# Patient Record
Sex: Male | Born: 1980 | Race: White | Hispanic: No | Marital: Married | Smoking: Current every day smoker
Health system: Southern US, Community
[De-identification: ages and names within clinical notes are randomized; demographics above are authoritative.]

## PROBLEM LIST (undated history)

## (undated) DIAGNOSIS — M479 Spondylosis, unspecified: Secondary | ICD-10-CM

## (undated) DIAGNOSIS — R011 Cardiac murmur, unspecified: Secondary | ICD-10-CM

## (undated) DIAGNOSIS — M545 Low back pain, unspecified: Secondary | ICD-10-CM

## (undated) DIAGNOSIS — N289 Disorder of kidney and ureter, unspecified: Secondary | ICD-10-CM

## (undated) DIAGNOSIS — Z87442 Personal history of urinary calculi: Secondary | ICD-10-CM

## (undated) DIAGNOSIS — N200 Calculus of kidney: Secondary | ICD-10-CM

## (undated) DIAGNOSIS — Z87898 Personal history of other specified conditions: Secondary | ICD-10-CM

---

## 2010-09-15 ENCOUNTER — Ambulatory Visit
Admission: RE | Admit: 2010-09-15 | Discharge: 2010-09-15 | Disposition: A | Discharge status: Home / Self Care | Payer: Medicaid Other | Source: Ambulatory Visit | Attending: Orthopedic Surgery | Admitting: Orthopedic Surgery

## 2010-09-15 ENCOUNTER — Other Ambulatory Visit: Payer: Self-pay | Admitting: Orthopedic Surgery

## 2010-09-15 DIAGNOSIS — M545 Low back pain: Secondary | ICD-10-CM

## 2010-09-17 ENCOUNTER — Ambulatory Visit: Payer: Medicaid Other | Admitting: Physical Therapy

## 2013-05-15 ENCOUNTER — Emergency Department (HOSPITAL_BASED_OUTPATIENT_CLINIC_OR_DEPARTMENT_OTHER)
Admission: EM | Admit: 2013-05-15 | Discharge: 2013-05-15 | Disposition: A | Discharge status: Home / Self Care | Payer: Medicaid Other | Attending: Emergency Medicine | Admitting: Emergency Medicine

## 2013-05-15 ENCOUNTER — Encounter (HOSPITAL_BASED_OUTPATIENT_CLINIC_OR_DEPARTMENT_OTHER): Payer: Self-pay | Admitting: Emergency Medicine

## 2013-05-15 DIAGNOSIS — K047 Periapical abscess without sinus: Secondary | ICD-10-CM | POA: Insufficient documentation

## 2013-05-15 DIAGNOSIS — F17200 Nicotine dependence, unspecified, uncomplicated: Secondary | ICD-10-CM | POA: Insufficient documentation

## 2013-05-15 MED ORDER — HYDROCODONE-ACETAMINOPHEN 5-325 MG PO TABS: 1.0000 | ORAL_TABLET | ORAL | Status: DC | PRN

## 2013-05-15 MED ORDER — CLINDAMYCIN HCL 150 MG PO CAPS: 150.0000 mg | ORAL_CAPSULE | Freq: Three times a day (TID) | ORAL | Status: DC

## 2013-05-15 NOTE — ED Notes (Signed)
Toothache x 3 days. He woke with swelling to his left jaw.

## 2013-05-15 NOTE — ED Provider Notes (Signed)
History/physical exam/procedure(s) were performed by non-physician practitioner and as supervising physician I was immediately available for consultation/collaboration. I have reviewed all notes and am in agreement with care and plan.   Yong Wahlquist S Caitriona Sundquist, MD 05/15/13 1608 

## 2013-05-15 NOTE — ED Provider Notes (Signed)
CSN: [id number]     Arrival date & time 05/15/13  1110 History   First MD Initiated Contact with Patient 05/15/13 1125     Chief Complaint  Patient presents with  . Dental Pain     (Consider location/radiation/quality/duration/timing/severity/associated sxs/prior Treatment) HPI Comments: Pt states that he cracked the tooth a long time ago and the pain started 3 days ago and then he woke up with it swollen this morning  Patient is a 33 y.o. male presenting with tooth pain. The history is provided by the patient. No language interpreter was used.  Dental Pain Location:  Lower Quality:  Aching Severity:  Moderate Onset quality:  Gradual Timing:  Constant Progression:  Worsening Chronicity:  New Context: abscess     History reviewed. No pertinent past medical history. History reviewed. No pertinent past surgical history. No family history on file. History  Substance Use Topics  . Smoking status: Current Every Day Smoker -- 1.00 packs/day    Types: Cigarettes  . Smokeless tobacco: Not on file  . Alcohol Use: Yes    Review of Systems  Constitutional: Negative.   Respiratory: Negative.   Cardiovascular: Negative.       Allergies  Review of patient's allergies indicates no known allergies.  Home Medications   Current Outpatient Rx  Name  Route  Sig  Dispense  Refill  . clindamycin (CLEOCIN) 150 MG capsule   Oral   Take 1 capsule (150 mg total) by mouth 3 (three) times daily.   21 capsule   0   . HYDROcodone-acetaminophen (NORCO/VICODIN) 5-325 MG per tablet   Oral   Take 1-2 tablets by mouth every 4 (four) hours as needed.   15 tablet   0    BP 151/83  Pulse 63  Temp(Src) 98 F (36.7 C) (Oral)  Resp 20  Ht 5\' 5"  (1.651 m)  Wt 145 lb (65.772 kg)  BMI 24.13 kg/m2  SpO2 100% Physical Exam  Vitals reviewed. Constitutional: He appears well-developed and well-nourished.  HENT:  Right Ear: External ear normal.  Left Ear: External ear normal.    Mouth/Throat:    Cardiovascular: Normal rate and regular rhythm.   Pulmonary/Chest: Effort normal and breath sounds normal.    ED Course  Procedures (including critical care time) Labs Review Labs Reviewed - No data to display Imaging Review No results found.   EKG Interpretation None      MDM   Final diagnoses:  Dental abscess    Pt treated with clinda and hydrocodone and given dental referral. Pt has no airway compromise or sign of ludwigs angina    Teressa LowerVrinda Jamarien Rodkey, NP 05/15/13 1149

## 2013-05-15 NOTE — Discharge Instructions (Signed)
Abscessed Tooth  An abscessed tooth is an infection around your tooth. It may be caused by holes or damage to the tooth (cavity) or a dental disease. An abscessed tooth causes mild to very bad pain in and around the tooth. See your dentist right away if you have tooth or gum pain.  HOME CARE   Take your medicine as told. Finish it even if you start to feel better.   Do not drive after taking pain medicine.   Rinse your mouth (gargle) often with salt water ( teaspoon salt in 8 ounces of warm water).   Do not apply heat to the outside of your face.  GET HELP RIGHT AWAY IF:    You have a temperature by mouth above 102 F (38.9 C), not controlled by medicine.   You have chills and a very bad headache.   You have problems breathing or swallowing.   Your mouth will not open.   You develop puffiness (swelling) on the neck or around the eye.   Your pain is not helped by medicine.   Your pain is getting worse instead of better.  MAKE SURE YOU:    Understand these instructions.   Will watch your condition.   Will get help right away if you are not doing well or get worse.  Document Released: 07/08/2007 Document Revised: 04/13/2011 Document Reviewed: 04/29/2010  Exit[employer] Patient Information 2014 [employer].

## 2013-10-22 ENCOUNTER — Emergency Department (HOSPITAL_BASED_OUTPATIENT_CLINIC_OR_DEPARTMENT_OTHER)
Admission: EM | Admit: 2013-10-22 | Discharge: 2013-10-22 | Disposition: A | Discharge status: Home / Self Care | Payer: Medicaid Other | Attending: Emergency Medicine | Admitting: Emergency Medicine

## 2013-10-22 ENCOUNTER — Encounter (HOSPITAL_BASED_OUTPATIENT_CLINIC_OR_DEPARTMENT_OTHER): Payer: Self-pay | Admitting: Emergency Medicine

## 2013-10-22 DIAGNOSIS — Z792 Long term (current) use of antibiotics: Secondary | ICD-10-CM | POA: Insufficient documentation

## 2013-10-22 DIAGNOSIS — M545 Low back pain, unspecified: Secondary | ICD-10-CM

## 2013-10-22 DIAGNOSIS — IMO0002 Reserved for concepts with insufficient information to code with codable children: Secondary | ICD-10-CM | POA: Insufficient documentation

## 2013-10-22 DIAGNOSIS — W010XXA Fall on same level from slipping, tripping and stumbling without subsequent striking against object, initial encounter: Secondary | ICD-10-CM | POA: Insufficient documentation

## 2013-10-22 DIAGNOSIS — F17200 Nicotine dependence, unspecified, uncomplicated: Secondary | ICD-10-CM | POA: Insufficient documentation

## 2013-10-22 DIAGNOSIS — W19XXXA Unspecified fall, initial encounter: Secondary | ICD-10-CM

## 2013-10-22 DIAGNOSIS — Y9389 Activity, other specified: Secondary | ICD-10-CM | POA: Insufficient documentation

## 2013-10-22 DIAGNOSIS — Y929 Unspecified place or not applicable: Secondary | ICD-10-CM | POA: Insufficient documentation

## 2013-10-22 DIAGNOSIS — Z8739 Personal history of other diseases of the musculoskeletal system and connective tissue: Secondary | ICD-10-CM | POA: Insufficient documentation

## 2013-10-22 HISTORY — DX: Spondylosis, unspecified: M47.9

## 2013-10-22 MED ORDER — OXYCODONE-ACETAMINOPHEN 5-325 MG PO TABS: 1.0000 | ORAL_TABLET | ORAL | Status: DC | PRN

## 2013-10-22 MED ORDER — CYCLOBENZAPRINE HCL 10 MG PO TABS
10.0000 mg | ORAL_TABLET | Freq: Two times a day (BID) | ORAL | Status: DC | PRN
Start: 2013-10-22 — End: 2014-03-01

## 2013-10-22 MED ORDER — IBUPROFEN 800 MG PO TABS: 800.0000 mg | ORAL_TABLET | Freq: Three times a day (TID) | ORAL | Status: DC

## 2013-10-22 MED ORDER — IBUPROFEN 800 MG PO TABS
800.0000 mg | ORAL_TABLET | Freq: Once | ORAL | Status: AC
  Administered 2013-10-22: 800 mg via ORAL
  Filled 2013-10-22: qty 1

## 2013-10-22 MED ORDER — OXYCODONE-ACETAMINOPHEN 5-325 MG PO TABS
1.0000 | ORAL_TABLET | Freq: Once | ORAL | Status: AC
  Administered 2013-10-22: 1 via ORAL
  Filled 2013-10-22: qty 1

## 2013-10-22 NOTE — ED Notes (Signed)
Patient slipped in the yard today and c/o lower back pain

## 2013-10-22 NOTE — ED Provider Notes (Signed)
CSN: [id number]     Arrival date & time 10/22/13  1115 History   First MD Initiated Contact with Patient 10/22/13 1203     Chief Complaint  Patient presents with  . Back Pain     (Consider location/radiation/quality/duration/timing/severity/associated sxs/prior Treatment) Patient is a 33 y.o. male presenting with back pain. The history is provided by the patient. No language interpreter was used.  Back Pain Location:  Lumbar spine Quality:  Stiffness and stabbing Pain severity:  Severe Onset quality:  Sudden Timing:  Constant Associated symptoms: no abdominal pain, no chest pain, no fever, no numbness and no weakness   Associated symptoms comment:  He fell this morning onto his left lower back with increasing pain since the fall. No radiation of the pain, numbness, weakness of extremity or urinary/bowel incontinence.    Past Medical History  Diagnosis Date  . Degenerative joint disease of low back    History reviewed. No pertinent past surgical history. History reviewed. No pertinent family history. History  Substance Use Topics  . Smoking status: Current Every Day Smoker -- 1.00 packs/day    Types: Cigarettes  . Smokeless tobacco: Not on file  . Alcohol Use: Yes    Review of Systems  Constitutional: Negative for fever and chills.  Respiratory: Negative.  Negative for shortness of breath.   Cardiovascular: Negative.  Negative for chest pain.  Gastrointestinal: Negative.  Negative for abdominal pain.  Genitourinary: Negative.  Negative for difficulty urinating.  Musculoskeletal: Positive for back pain. Negative for neck pain.       See HPI  Skin: Negative.   Neurological: Negative.  Negative for weakness and numbness.      Allergies  Review of patient's allergies indicates no known allergies.  Home Medications   Prior to Admission medications   Medication Sig Start Date End Date Taking? Authorizing Provider  clindamycin (CLEOCIN) 150 MG capsule Take 1 capsule  (150 mg total) by mouth 3 (three) times daily. 05/15/13   Teressa Lower, NP  HYDROcodone-acetaminophen (NORCO/VICODIN) 5-325 MG per tablet Take 1-2 tablets by mouth every 4 (four) hours as needed. 05/15/13   Teressa Lower, NP   BP 143/82  Pulse 64  Temp(Src) 97.9 F (36.6 C)  Resp 18  SpO2 100% Physical Exam  Constitutional: He is oriented to person, place, and time. He appears well-developed and well-nourished.  Neck: Normal range of motion.  Pulmonary/Chest: Effort normal.  Abdominal: Soft. He exhibits no mass. There is no tenderness.  Genitourinary:  No CVA tenderness  Musculoskeletal: Normal range of motion.  Left paralumbar tenderness without swelling, discoloration or midline tenderness. No sciatic tenderness.   Neurological: He is alert and oriented to person, place, and time. He has normal reflexes. No sensory deficit.  Skin: Skin is warm and dry.  Psychiatric: He has a normal mood and affect.    ED Course  Procedures (including critical care time) Labs Review Labs Reviewed - No data to display  Imaging Review No results found.   EKG Interpretation None      MDM   Final diagnoses:  None    1. Fall 2. Back pain  Suspect injury limited to musculoskeletal. Worse over time, worse with movement and no neurologic deficits or concerns.     Arnoldo Hooker, PA-C 10/22/13 1216

## 2013-10-22 NOTE — ED Provider Notes (Signed)
Medical screening examination/treatment/procedure(s) were performed by non-physician practitioner and as supervising physician I was immediately available for consultation/collaboration.   Linwood Dibbles, MD 10/22/13 [phone]

## 2013-10-22 NOTE — ED Notes (Signed)
PT discharged to home with family. Danny Lawless (GF) driving.   NAD.

## 2013-10-22 NOTE — Discharge Instructions (Signed)
Cryotherapy °Cryotherapy means treatment with cold. Ice or gel packs can be used to reduce both pain and swelling. Ice is the most helpful within the first 24 to 48 hours after an injury or flare-up from overusing a muscle or joint. Sprains, strains, spasms, burning pain, shooting pain, and aches can all be eased with ice. Ice can also be used when recovering from surgery. Ice is effective, has very few side effects, and is safe for most people to use. °PRECAUTIONS  °Ice is not a safe treatment option for people with: °· Raynaud phenomenon. This is a condition affecting small blood vessels in the extremities. Exposure to cold may cause your problems to return. °· Cold hypersensitivity. There are many forms of cold hypersensitivity, including: °¨ Cold urticaria. Red, itchy hives appear on the skin when the tissues begin to warm after being iced. °¨ Cold erythema. This is a red, itchy rash caused by exposure to cold. °¨ Cold hemoglobinuria. Red blood cells break down when the tissues begin to warm after being iced. The hemoglobin that carry oxygen are passed into the urine because they cannot combine with blood proteins fast enough. °· Numbness or altered sensitivity in the area being iced. °If you have any of the following conditions, do not use ice until you have discussed cryotherapy with your caregiver: °· Heart conditions, such as arrhythmia, angina, or chronic heart disease. °· High blood pressure. °· Healing wounds or open skin in the area being iced. °· Current infections. °· Rheumatoid arthritis. °· Poor circulation. °· Diabetes. °Ice slows the blood flow in the region it is applied. This is beneficial when trying to stop inflamed tissues from spreading irritating chemicals to surrounding tissues. However, if you expose your skin to cold temperatures for too long or without the proper protection, you can damage your skin or nerves. Watch for signs of skin damage due to cold. °HOME CARE INSTRUCTIONS °Follow  these tips to use ice and cold packs safely. °· Place a dry or damp towel between the ice and skin. A damp towel will cool the skin more quickly, so you may need to shorten the time that the ice is used. °· For a more rapid response, add gentle compression to the ice. °· Ice for no more than 10 to 20 minutes at a time. The bonier the area you are icing, the less time it will take to get the benefits of ice. °· Check your skin after 5 minutes to make sure there are no signs of a poor response to cold or skin damage. °· Rest 20 minutes or more between uses. °· Once your skin is numb, you can end your treatment. You can test numbness by very lightly touching your skin. The touch should be so light that you do not see the skin dimple from the pressure of your fingertip. When using ice, most people will feel these normal sensations in this order: cold, burning, aching, and numbness. °· Do not use ice on someone who cannot communicate their responses to pain, such as small children or people with dementia. °HOW TO MAKE AN ICE PACK °Ice packs are the most common way to use ice therapy. Other methods include ice massage, ice baths, and cryosprays. Muscle creams that cause a cold, tingly feeling do not offer the same benefits that ice offers and should not be used as a substitute unless recommended by your caregiver. °To make an ice pack, do one of the following: °· Place crushed ice or a   bag of frozen vegetables in a sealable plastic bag. Squeeze out the excess air. Place this bag inside another plastic bag. Slide the bag into a pillowcase or place a damp towel between your skin and the bag. °· Mix 3 parts water with 1 part rubbing alcohol. Freeze the mixture in a sealable plastic bag. When you remove the mixture from the freezer, it will be slushy. Squeeze out the excess air. Place this bag inside another plastic bag. Slide the bag into a pillowcase or place a damp towel between your skin and the bag. °SEEK MEDICAL CARE  IF: °· You develop white spots on your skin. This may give the skin a blotchy (mottled) appearance. °· Your skin turns blue or pale. °· Your skin becomes waxy or hard. °· Your swelling gets worse. °MAKE SURE YOU:  °· Understand these instructions. °· Will watch your condition. °· Will get help right away if you are not doing well or get worse. °Document Released: 09/15/2010 Document Revised: 06/05/2013 Document Reviewed: 09/15/2010 °Exit[employer]® Patient Information ©2015 [employer]. This information is not intended to replace advice given to you by your health care provider. Make sure you discuss any questions you have with your health care provider. °Back Pain, Adult °Low back pain is very common. About 1 in 5 people have back pain. The cause of low back pain is rarely dangerous. The pain often gets better over time. About half of people with a sudden onset of back pain feel better in just 2 weeks. About 8 in 10 people feel better by 6 weeks.  °CAUSES °Some common causes of back pain include: °· Strain of the muscles or ligaments supporting the spine. °· Wear and tear (degeneration) of the spinal discs. °· Arthritis. °· Direct injury to the back. °DIAGNOSIS °Most of the time, the direct cause of low back pain is not known. However, back pain can be treated effectively even when the exact cause of the pain is unknown. Answering your caregiver's questions about your overall health and symptoms is one of the most accurate ways to make sure the cause of your pain is not dangerous. If your caregiver needs more information, he or she may order lab work or imaging tests (X-rays or MRIs). However, even if imaging tests show changes in your back, this usually does not require surgery. °HOME CARE INSTRUCTIONS °For many people, back pain returns. Since low back pain is rarely dangerous, it is often a condition that people can learn to manage on their own.  °· Remain active. It is stressful on the back to sit or stand in one  place. Do not sit, drive, or stand in one place for more than 30 minutes at a time. Take short walks on level surfaces as soon as pain allows. Try to increase the length of time you walk each day. °· Do not stay in bed. Resting more than 1 or 2 days can delay your recovery. °· Do not avoid exercise or work. Your body is made to move. It is not dangerous to be active, even though your back may hurt. Your back will likely heal faster if you return to being active before your pain is gone. °· Pay attention to your body when you  bend and lift. Many people have less discomfort when lifting if they bend their knees, keep the load close to their bodies, and avoid twisting. Often, the most comfortable positions are those that put less stress on your recovering back. °· Find a comfortable position to sleep. Use a firm mattress and lie on your side with your   knees slightly bent. If you lie on your back, put a pillow under your knees. °· Only take over-the-counter or prescription medicines as directed by your caregiver. Over-the-counter medicines to reduce pain and inflammation are often the most helpful. Your caregiver may prescribe muscle relaxant drugs. These medicines help dull your pain so you can more quickly return to your normal activities and healthy exercise. °· Put ice on the injured area. °· Put ice in a plastic bag. °· Place a towel between your skin and the bag. °· Leave the ice on for 15-20 minutes, 03-04 times a day for the first 2 to 3 days. After that, ice and heat may be alternated to reduce pain and spasms. °· Ask your caregiver about trying back exercises and gentle massage. This may be of some benefit. °· Avoid feeling anxious or stressed. Stress increases muscle tension and can worsen back pain. It is important to recognize when you are anxious or stressed and learn ways to manage it. Exercise is a great option. °SEEK MEDICAL CARE IF: °· You have pain that is not relieved with rest or medicine. °· You  have pain that does not improve in 1 week. °· You have new symptoms. °· You are generally not feeling well. °SEEK IMMEDIATE MEDICAL CARE IF:  °· You have pain that radiates from your back into your legs. °· You develop new bowel or bladder control problems. °· You have unusual weakness or numbness in your arms or legs. °· You develop nausea or vomiting. °· You develop abdominal pain. °· You feel faint. °Document Released: 01/19/2005 Document Revised: 07/21/2011 Document Reviewed: 05/23/2013 °ExitCare® Patient Information ©2015 [employer]. This information is not intended to replace advice given to you by your health care provider. Make sure you discuss any questions you have with your health care provider. ° °

## 2014-03-01 ENCOUNTER — Encounter (HOSPITAL_BASED_OUTPATIENT_CLINIC_OR_DEPARTMENT_OTHER): Payer: Self-pay | Admitting: *Deleted

## 2014-03-01 ENCOUNTER — Emergency Department (HOSPITAL_BASED_OUTPATIENT_CLINIC_OR_DEPARTMENT_OTHER)
Admission: EM | Admit: 2014-03-01 | Discharge: 2014-03-01 | Disposition: A | Discharge status: Home / Self Care | Payer: Medicaid Other | Attending: Emergency Medicine | Admitting: Emergency Medicine

## 2014-03-01 DIAGNOSIS — S39012A Strain of muscle, fascia and tendon of lower back, initial encounter: Secondary | ICD-10-CM | POA: Insufficient documentation

## 2014-03-01 DIAGNOSIS — Y9289 Other specified places as the place of occurrence of the external cause: Secondary | ICD-10-CM | POA: Insufficient documentation

## 2014-03-01 DIAGNOSIS — X58XXXA Exposure to other specified factors, initial encounter: Secondary | ICD-10-CM | POA: Insufficient documentation

## 2014-03-01 DIAGNOSIS — Z720 Tobacco use: Secondary | ICD-10-CM | POA: Insufficient documentation

## 2014-03-01 DIAGNOSIS — Z8739 Personal history of other diseases of the musculoskeletal system and connective tissue: Secondary | ICD-10-CM | POA: Insufficient documentation

## 2014-03-01 DIAGNOSIS — Y990 Civilian activity done for income or pay: Secondary | ICD-10-CM | POA: Insufficient documentation

## 2014-03-01 DIAGNOSIS — Y9389 Activity, other specified: Secondary | ICD-10-CM | POA: Insufficient documentation

## 2014-03-01 MED ORDER — OXYCODONE-ACETAMINOPHEN 5-325 MG PO TABS: 1.0000 | ORAL_TABLET | Freq: Three times a day (TID) | ORAL | Status: DC | PRN

## 2014-03-01 MED ORDER — KETOROLAC TROMETHAMINE 60 MG/2ML IM SOLN
60.0000 mg | Freq: Once | INTRAMUSCULAR | Status: AC
  Administered 2014-03-01: 60 mg via INTRAMUSCULAR
  Filled 2014-03-01: qty 2

## 2014-03-01 MED ORDER — IBUPROFEN 600 MG PO TABS: 600.0000 mg | ORAL_TABLET | Freq: Four times a day (QID) | ORAL | Status: DC | PRN

## 2014-03-01 MED ORDER — OXYCODONE-ACETAMINOPHEN 5-325 MG PO TABS
2.0000 | ORAL_TABLET | Freq: Once | ORAL | Status: AC
  Administered 2014-03-01: 2 via ORAL
  Filled 2014-03-01: qty 2

## 2014-03-01 NOTE — ED Provider Notes (Signed)
CSN: [id number]     Arrival date & time 03/01/14  1403 History   First MD Initiated Contact with Patient 03/01/14 1532     Chief Complaint  Patient presents with  . Back Injury     Patient is a 34 y.o. male presenting with back pain. The history is provided by the patient.  Back Pain Location:  Lumbar spine Quality:  Aching Radiates to:  Does not radiate Pain severity:  Severe Onset quality:  Sudden Duration:  2 hours Timing:  Constant Progression:  Worsening Chronicity:  New Context: occupational injury   Relieved by:  Nothing Worsened by:  Ambulation Associated symptoms: no abdominal pain, no bladder incontinence, no chest pain and no weakness   Risk factors: no recent surgery   Patient reports while at work he was lifting shingles and had sudden onset of sharp low back pain.  It does not radiate.  No falls.  He has had pain in back previously but never this severe No h/o back surgery He denies any other medical conditions  Past Medical History  Diagnosis Date  . Degenerative joint disease of low back    History reviewed. No pertinent past surgical history. No family history on file. History  Substance Use Topics  . Smoking status: Current Every Day Smoker -- 1.00 packs/day    Types: Cigarettes  . Smokeless tobacco: Not on file  . Alcohol Use: Yes    Review of Systems  Cardiovascular: Negative for chest pain.  Gastrointestinal: Negative for abdominal pain.  Genitourinary: Negative for bladder incontinence.  Musculoskeletal: Positive for back pain.  Neurological: Negative for weakness.  All other systems reviewed and are negative.     Allergies  Review of patient's allergies indicates no known allergies.  Home Medications   Prior to Admission medications   Medication Sig Start Date End Date Taking? Authorizing Provider  ibuprofen (ADVIL,MOTRIN) 600 MG tablet Take 1 tablet (600 mg total) by mouth every 6 (six) hours as needed. 03/01/14   Joya Gaskinsonald W Freddie Dymek,  MD  oxyCODONE-acetaminophen (PERCOCET/ROXICET) 5-325 MG per tablet Take 1 tablet by mouth every 8 (eight) hours as needed for severe pain. 03/01/14   Joya Gaskinsonald W Jashae Wiggs, MD   BP 170/78 mmHg  Pulse 76  Temp(Src) 98 F (36.7 C) (Oral)  Resp 18  Ht 5' 5.5" (1.664 m)  Wt 150 lb (68.04 kg)  BMI 24.57 kg/m2  SpO2 100% Physical Exam CONSTITUTIONAL: Well developed/well nourished, pt is uncomfortable appearing HEAD: Normocephalic/atraumatic EYES: EOMI ENMT: Mucous membranes moist, poor dentition NECK: supple no meningeal signs SPINE/BACK:lumbar spine tenderness, No bruising/crepitance/stepoffs noted to spine CV: S1/S2 noted, no murmurs/rubs/gallops noted LUNGS: Lungs are clear to auscultation bilaterally, no apparent distress ABDOMEN: soft, nontender, no rebound or guarding GU:no cva tenderness NEURO: Awake/alert, equal motor 5/5 strength noted with the following: hip flexion/knee flexion/extension, foot dorsi/plantar flexion, great toe extension intact bilaterally, no clonus bilaterally, plantar reflex appropriate (toes downgoing), no sensory deficit in any dermatome.  Equal patellar/achilles reflex noted (2+) in bilateral lower extremities.  Pt is able to ambulate unassisted. EXTREMITIES: pulses normal, full ROM SKIN: warm, color normal PSYCH: no abnormalities of mood noted, alert and oriented to situation   ED Course  Procedures   Medications  ketorolac (TORADOL) injection 60 mg (60 mg Intramuscular Given 03/01/14 1601)  oxyCODONE-acetaminophen (PERCOCET/ROXICET) 5-325 MG per tablet 2 tablet (2 tablets Oral Given 03/01/14 1600)   Pt in severe pain but is ambulatory He has no neuro deficits Discussed strict return precautions  MDM   Final diagnoses:  Lumbar strain, initial encounter    Nursing notes including past medical history and social history reviewed and considered in documentation Narcotic database reviewed and considered in decision making     Joya Gaskins,  MD 03/01/14 1614

## 2014-03-01 NOTE — ED Notes (Signed)
Back injury at work today. No drug screen required per pt.

## 2014-03-01 NOTE — Discharge Instructions (Signed)

## 2014-03-05 ENCOUNTER — Encounter (HOSPITAL_BASED_OUTPATIENT_CLINIC_OR_DEPARTMENT_OTHER): Payer: Self-pay

## 2014-03-05 ENCOUNTER — Emergency Department (HOSPITAL_BASED_OUTPATIENT_CLINIC_OR_DEPARTMENT_OTHER)
Admission: EM | Admit: 2014-03-05 | Discharge: 2014-03-05 | Disposition: A | Discharge status: Home / Self Care | Payer: Medicaid Other | Attending: Emergency Medicine | Admitting: Emergency Medicine

## 2014-03-05 DIAGNOSIS — S39012D Strain of muscle, fascia and tendon of lower back, subsequent encounter: Secondary | ICD-10-CM | POA: Insufficient documentation

## 2014-03-05 DIAGNOSIS — Z720 Tobacco use: Secondary | ICD-10-CM | POA: Insufficient documentation

## 2014-03-05 DIAGNOSIS — M479 Spondylosis, unspecified: Secondary | ICD-10-CM | POA: Insufficient documentation

## 2014-03-05 DIAGNOSIS — W1849XD Other slipping, tripping and stumbling without falling, subsequent encounter: Secondary | ICD-10-CM | POA: Insufficient documentation

## 2014-03-05 MED ORDER — OXYCODONE-ACETAMINOPHEN 5-325 MG PO TABS: 1.0000 | ORAL_TABLET | Freq: Three times a day (TID) | ORAL | Status: DC | PRN

## 2014-03-05 MED ORDER — CYCLOBENZAPRINE HCL 5 MG PO TABS: 5.0000 mg | ORAL_TABLET | Freq: Two times a day (BID) | ORAL | Status: DC | PRN

## 2014-03-05 NOTE — Discharge Instructions (Signed)
Back Pain, Adult °Back pain is very common. The pain often gets better over time. The cause of back pain is usually not dangerous. Most people can learn to manage their back pain on their own.  °HOME CARE  °· Stay active. Start with short walks on flat ground if you can. Try to walk farther each day. °· Do not sit, drive, or stand in one place for more than 30 minutes. Do not stay in bed. °· Do not avoid exercise or work. Activity can help your back heal faster. °· Be careful when you bend or lift an object. Bend at your knees, keep the object close to you, and do not twist. °· Sleep on a firm mattress. Lie on your side, and bend your knees. If you lie on your back, put a pillow under your knees. °· Only take medicines as told by your doctor. °· Put ice on the injured area. °¨ Put ice in a plastic bag. °¨ Place a towel between your skin and the bag. °¨ Leave the ice on for 15-20 minutes, 03-04 times a day for the first 2 to 3 days. After that, you can switch between ice and heat packs. °· Ask your doctor about back exercises or massage. °· Avoid feeling anxious or stressed. Find good ways to deal with stress, such as exercise. °GET HELP RIGHT AWAY IF:  °· Your pain does not go away with rest or medicine. °· Your pain does not go away in 1 week. °· You have new problems. °· You do not feel well. °· The pain spreads into your legs. °· You cannot control when you poop (bowel movement) or pee (urinate). °· Your arms or legs feel weak or lose feeling (numbness). °· You feel sick to your stomach (nauseous) or throw up (vomit). °· You have belly (abdominal) pain. °· You feel like you may pass out (faint). °MAKE SURE YOU:  °· Understand these instructions. °· Will watch your condition. °· Will get help right away if you are not doing well or get worse. °Document Released: 07/08/2007 Document Revised: 04/13/2011 Document Reviewed: 05/23/2013 °[employer]® Patient Information ©2015 [employer]. This information is not intended  to replace advice given to you by your health care provider. Make sure you discuss any questions you have with your health care provider. ° °

## 2014-03-05 NOTE — ED Notes (Signed)
Pt reports continued back pain since Thursday. Reports he took the medication and he "can't stand up straight."

## 2014-03-05 NOTE — ED Notes (Signed)
NP at bedside.

## 2014-03-05 NOTE — ED Provider Notes (Signed)
CSN: [id number]     Arrival date & time 03/05/14  1040 History   First MD Initiated Contact with Patient 03/05/14 1044     Chief Complaint  Patient presents with  . Back Pain     (Consider location/radiation/quality/duration/timing/severity/associated sxs/prior Treatment) HPI Comments: Pt comes in with c/o right sided back pain that started 5 days ago after injury at work. 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 Pt states that he was seen in the er on 1/28 and the percocet and ibuprofen helped but he is out and can't get into the clinic we referred him to until next week. Denies numbness, weakness or incontinence. States that he can't stand up completely straight because of the pain  The history is provided by the patient. No language interpreter was used.    Past Medical History  Diagnosis Date  . Degenerative joint disease of low back    History reviewed. No pertinent past surgical history. No family history on file. History  Substance Use Topics  . Smoking status: Current Every Day Smoker -- 1.00 packs/day    Types: Cigarettes  . Smokeless tobacco: Not on file  . Alcohol Use: Yes    Review of Systems  All other systems reviewed and are negative.     Allergies  Review of patient's allergies indicates no known allergies.  Home Medications   Prior to Admission medications   Medication Sig Start Date End Date Taking? Authorizing Provider  ibuprofen (ADVIL,MOTRIN) 600 MG tablet Take 1 tablet (600 mg total) by mouth every 6 (six) hours as needed. 03/01/14   Joya Gaskinsonald W Wickline, MD  oxyCODONE-acetaminophen (PERCOCET/ROXICET) 5-325 MG per tablet Take 1 tablet by mouth every 8 (eight) hours as needed for severe pain. 03/01/14   Joya Gaskinsonald W Wickline, MD   BP 145/77 mmHg  Pulse 57  Temp(Src) 98 F (36.7 C) (Oral)  Resp 16  SpO2 100% Physical Exam  Constitutional: He is oriented to person, place, and time. He appears well-developed and  well-nourished.  Cardiovascular: Normal rate and regular rhythm.   Pulmonary/Chest: Effort normal and breath sounds normal.  Musculoskeletal: Normal range of motion.  Right sided lumbar paraspinal tender. Full rom  Neurological: He is alert and oriented to person, place, and time. He exhibits normal muscle tone. Coordination normal.  5/5 strength to bilateral lower extremities  Skin: Skin is warm and dry.  Psychiatric: He has a normal mood and affect.  Nursing note and vitals reviewed.   ED Course  Procedures (including critical care time) Labs Review Labs Reviewed - No data to display  Imaging Review No results found.   EKG Interpretation None      MDM   Final diagnoses:  Lumbar strain, subsequent encounter    Pt is neurologically intact. Will give more oxycodone and flexeril. Discussed return precautions. Pt verbalized understanding   Teressa LowerVrinda Islah Eve, NP 03/05/14 1101  Purvis SheffieldForrest Harrison, MD 03/05/14 1240

## 2014-05-08 ENCOUNTER — Emergency Department (HOSPITAL_BASED_OUTPATIENT_CLINIC_OR_DEPARTMENT_OTHER): Payer: Medicaid Other

## 2014-05-08 ENCOUNTER — Encounter (HOSPITAL_BASED_OUTPATIENT_CLINIC_OR_DEPARTMENT_OTHER): Payer: Self-pay | Admitting: *Deleted

## 2014-05-08 ENCOUNTER — Emergency Department (HOSPITAL_BASED_OUTPATIENT_CLINIC_OR_DEPARTMENT_OTHER)
Admission: EM | Admit: 2014-05-08 | Discharge: 2014-05-08 | Disposition: A | Discharge status: Home / Self Care | Payer: Self-pay | Attending: Emergency Medicine | Admitting: Emergency Medicine

## 2014-05-08 ENCOUNTER — Telehealth: Payer: Self-pay | Admitting: *Deleted

## 2014-05-08 DIAGNOSIS — N201 Calculus of ureter: Secondary | ICD-10-CM | POA: Insufficient documentation

## 2014-05-08 DIAGNOSIS — Z720 Tobacco use: Secondary | ICD-10-CM | POA: Insufficient documentation

## 2014-05-08 DIAGNOSIS — M47896 Other spondylosis, lumbar region: Secondary | ICD-10-CM | POA: Insufficient documentation

## 2014-05-08 DIAGNOSIS — R109 Unspecified abdominal pain: Secondary | ICD-10-CM

## 2014-05-08 HISTORY — DX: Calculus of kidney: N20.0

## 2014-05-08 HISTORY — DX: Disorder of kidney and ureter, unspecified: N28.9

## 2014-05-08 LAB — BASIC METABOLIC PANEL
Anion gap: 8 (ref 5–15)
BUN: 15 mg/dL (ref 6–23)
CALCIUM: 9.1 mg/dL (ref 8.4–10.5)
CO2: 27 mmol/L (ref 19–32)
Chloride: 103 mmol/L (ref 96–112)
Creatinine, Ser: 0.79 mg/dL (ref 0.50–1.35)
GFR calc non Af Amer: >90 mL/min (ref >90)
GLUCOSE: 94 mg/dL (ref 70–99)
Potassium: 3.8 mmol/L (ref 3.5–5.1)
Sodium: 138 mmol/L (ref 135–145)

## 2014-05-08 LAB — CBC WITH DIFFERENTIAL/PLATELET
Basophils Absolute: 0 10*3/uL (ref 0.0–0.1)
Basophils Relative: 0 % (ref 0–1)
Eosinophils Absolute: 0 10*3/uL (ref 0.0–0.7)
Eosinophils Relative: 0 % (ref 0–5)
HCT: 44.7 % (ref 39.0–52.0)
HEMOGLOBIN: 15.3 g/dL (ref 13.0–17.0)
LYMPHS ABS: 2.8 10*3/uL (ref 0.7–4.0)
LYMPHS PCT: 26 % (ref 12–46)
MCH: 31.4 pg (ref 26.0–34.0)
MCHC: 34.2 g/dL (ref 30.0–36.0)
MCV: 91.8 fL (ref 78.0–100.0)
MONOS PCT: 7 % (ref 3–12)
Monocytes Absolute: 0.7 10*3/uL (ref 0.1–1.0)
NEUTROS PCT: 67 % (ref 43–77)
Neutro Abs: 7.1 10*3/uL (ref 1.7–7.7)
PLATELETS: 269 10*3/uL (ref 150–400)
RBC: 4.87 MIL/uL (ref 4.22–5.81)
RDW: 12.2 % (ref 11.5–15.5)
WBC: 10.7 10*3/uL — AB (ref 4.0–10.5)

## 2014-05-08 LAB — URINALYSIS, ROUTINE W REFLEX MICROSCOPIC
Bilirubin Urine: NEGATIVE
Glucose, UA: NEGATIVE mg/dL
Hgb urine dipstick: NEGATIVE
Ketones, ur: NEGATIVE mg/dL
Leukocytes, UA: NEGATIVE
NITRITE: NEGATIVE
Protein, ur: NEGATIVE mg/dL
Specific Gravity, Urine: 1.019 (ref 1.005–1.030)
UROBILINOGEN UA: 1 mg/dL (ref 0.0–1.0)
pH: 7 (ref 5.0–8.0)

## 2014-05-08 MED ORDER — MORPHINE SULFATE 4 MG/ML IJ SOLN
4.0000 mg | Freq: Once | INTRAMUSCULAR | Status: AC
  Administered 2014-05-08: 4 mg via INTRAVENOUS
  Filled 2014-05-08: qty 1

## 2014-05-08 MED ORDER — SODIUM CHLORIDE 0.9 % IV SOLN
1000.0000 mL | Freq: Once | INTRAVENOUS | Status: AC
  Administered 2014-05-08: 1000 mL via INTRAVENOUS

## 2014-05-08 MED ORDER — OXYCODONE-ACETAMINOPHEN 5-325 MG PO TABS
1.0000 | ORAL_TABLET | Freq: Three times a day (TID) | ORAL | Status: DC | PRN
Start: 2014-05-08 — End: 2015-12-11

## 2014-05-08 MED ORDER — SODIUM CHLORIDE 0.9 % IV SOLN
1000.0000 mL | INTRAVENOUS | Status: DC
  Administered 2014-05-08: 1000 mL via INTRAVENOUS

## 2014-05-08 MED ORDER — ONDANSETRON HCL 4 MG/2ML IJ SOLN
4.0000 mg | Freq: Once | INTRAMUSCULAR | Status: AC
  Administered 2014-05-08: 4 mg via INTRAVENOUS
  Filled 2014-05-08: qty 2

## 2014-05-08 MED ORDER — ONDANSETRON HCL 4 MG PO TABS
4.0000 mg | ORAL_TABLET | Freq: Four times a day (QID) | ORAL | Status: DC
Start: 2014-05-08 — End: 2015-12-11

## 2014-05-08 MED ORDER — KETOROLAC TROMETHAMINE 30 MG/ML IJ SOLN
30.0000 mg | Freq: Once | INTRAMUSCULAR | Status: AC
  Administered 2014-05-08: 30 mg via INTRAVENOUS
  Filled 2014-05-08: qty 1

## 2014-05-08 NOTE — Telephone Encounter (Signed)
Pharmacy called r/t pt only wanting to fill narcotic prescription.  Advised to fill both prescriptions or none.

## 2014-05-08 NOTE — ED Provider Notes (Signed)
CSN: [id number]     Arrival date & time 05/08/14  1042 History   First MD Initiated Contact with Patient 05/08/14 1105     Chief Complaint  Patient presents with  . Abdominal Pain   HPI Patient presents to the emergency room with complaints of urinary frequency and discomfort. Patient states he started noticing some symptoms on Friday but they mostly went away over the weekend. When he woke up he felt like his bladder was full and he cannot empty it fully.  That has persisted throughout the day and he has had to urinate multiple times. He urinates small amount and feels that he needs to urinate again shortly after that. He has pain in the suprapubic area. Fevers or chills. No vomiting or diarrhea. No discharge. Past Medical History  Diagnosis Date  . Degenerative joint disease of low back   . Renal disorder   . Kidney stones    History reviewed. No pertinent past surgical history. No family history on file. History  Substance Use Topics  . Smoking status: Current Every Day Smoker -- 1.00 packs/day    Types: Cigarettes  . Smokeless tobacco: Not on file  . Alcohol Use: Yes    Review of Systems  All other systems reviewed and are negative.     Allergies  Review of patient's allergies indicates no known allergies.  Home Medications   Prior to Admission medications   Medication Sig Start Date End Date Taking? Authorizing Provider  cyclobenzaprine (FLEXERIL) 5 MG tablet Take 1 tablet (5 mg total) by mouth 2 (two) times daily as needed. 03/05/14   Teressa Lower, NP  ibuprofen (ADVIL,MOTRIN) 600 MG tablet Take 1 tablet (600 mg total) by mouth every 6 (six) hours as needed. 03/01/14   Zadie Rhine, MD  ondansetron (ZOFRAN) 4 MG tablet Take 1 tablet (4 mg total) by mouth every 6 (six) hours. 05/08/14   Linwood Dibbles, MD  oxyCODONE-acetaminophen (PERCOCET/ROXICET) 5-325 MG per tablet Take 1-2 tablets by mouth every 8 (eight) hours as needed for moderate pain or severe pain. 05/08/14   Linwood Dibbles, MD   BP 134/81 mmHg  Pulse 62  Temp(Src) 98 F (36.7 C) (Oral)  Resp 16  Ht  (1.651 m)  Wt 150 lb (68.04 kg)  BMI 24.96 kg/m2  SpO2 100% Physical Exam  Constitutional: He appears well-developed and well-nourished. No distress.  HENT:  Head: Normocephalic and atraumatic.  Right Ear: External ear normal.  Left Ear: External ear normal.  Eyes: Conjunctivae are normal. Right eye exhibits no discharge. Left eye exhibits no discharge. No scleral icterus.  Neck: Neck supple. No tracheal deviation present.  Cardiovascular: Normal rate, regular rhythm and intact distal pulses.   Pulmonary/Chest: Effort normal and breath sounds normal. No stridor. No respiratory distress. He has no wheezes. He has no rales.  Abdominal: Soft. Bowel sounds are normal. He exhibits no distension. There is tenderness in the suprapubic area. There is no rebound and no guarding.  Musculoskeletal: He exhibits no edema or tenderness.  Neurological: He is alert. He has normal strength. No cranial nerve deficit (no facial droop, extraocular movements intact, no slurred speech) or sensory deficit. He exhibits normal muscle tone. He displays no seizure activity. Coordination normal.  Skin: Skin is warm and dry. No rash noted.  Psychiatric: He has a normal mood and affect.  Nursing note and vitals reviewed.   ED Course  Procedures (including critical care time) Labs Review Labs Reviewed  CBC WITH DIFFERENTIAL/PLATELET -  Abnormal; Notable for the following:    WBC 10.7 (*)    All other components within normal limits  URINE CULTURE  URINALYSIS, ROUTINE W REFLEX MICROSCOPIC  BASIC METABOLIC PANEL  RPR  HIV ANTIBODY (ROUTINE TESTING)  GC/CHLAMYDIA PROBE AMP ([facility])    Imaging Review Ct Renal Stone Study  05/08/2014   CLINICAL DATA:  Dysuria and frequency for 4 days  EXAM: CT ABDOMEN AND PELVIS WITHOUT CONTRAST  TECHNIQUE: Multidetector CT imaging of the abdomen and pelvis was performed following  the standard protocol without IV contrast.  COMPARISON:  None.  FINDINGS: Lung bases are free of acute infiltrate or sizable effusion.  The liver, gallbladder, spleen, adrenal glands and pancreas are normal in their CT appearance. The kidneys are well visualized bilaterally and reveal small 2-3 mm nonobstructing stones bilaterally. The collecting systems show very minimal fullness of the right renal collecting system. On image number 71 there is a small 1-2 mm partially obstructing stone identified within the distal right ureter. The left ureter is within normal limits. The bladder is well distended.  The appendix is not well visualized although no inflammatory changes to suggest appendicitis are seen. No acute bony abnormality is noted.  IMPRESSION: Nonobstructing renal stones bilaterally.  1-2 mm partially obstructing distal right ureteral stone as described.   Electronically Signed   By: Alcide CleverMark  Lukens M.D.   On: 05/08/2014 13:38   Medications  0.9 %  sodium chloride infusion (0 mLs Intravenous Stopped 05/08/14 1230)    Followed by  0.9 %  sodium chloride infusion (1,000 mLs Intravenous New Bag/Given 05/08/14 1239)  ondansetron (ZOFRAN) injection 4 mg (4 mg Intravenous Given 05/08/14 1127)  morphine 4 MG/ML injection 4 mg (4 mg Intravenous Given 05/08/14 1129)  morphine 4 MG/ML injection 4 mg (4 mg Intravenous Given 05/08/14 1247)  ketorolac (TORADOL) 30 MG/ML injection 30 mg (30 mg Intravenous Given 05/08/14 1351)     MDM   Final diagnoses:  Ureteral stone    Patient CT scan does show a 1-2 mm distal ureteral stone of his symptoms. Labs are otherwise unremarkable. He was treated with medications for pain in the emergency department. Plan is to discharge home with a prescription for Percocet and Zofran for nausea. Follow up with urology if the symptoms persist. Findings and plan were discussed with patient    Linwood DibblesJon Sedrick Tober, MD 05/08/14 1406

## 2014-05-08 NOTE — Discharge Instructions (Signed)

## 2014-05-08 NOTE — ED Notes (Signed)
C/o urgency to void but is unable urinated. Pt states he has a hx of kidney stones. States he thinks the last time he emptied his bladder fully was last pm and since then it has just been drops of urine when he urinates. No burning.

## 2014-05-08 NOTE — ED Notes (Signed)
Pt states he is still unable to urinate.

## 2014-05-09 LAB — HIV ANTIBODY (ROUTINE TESTING W REFLEX): HIV SCREEN 4TH GENERATION: NONREACTIVE

## 2014-05-09 LAB — RPR: RPR Ser Ql: NONREACTIVE

## 2014-05-09 LAB — URINE CULTURE
Colony Count: NO GROWTH
Culture: NO GROWTH

## 2014-05-09 LAB — GC/CHLAMYDIA PROBE AMP (~~LOC~~) NOT AT ARMC
Chlamydia: NEGATIVE
Neisseria Gonorrhea: NEGATIVE

## 2014-05-29 ENCOUNTER — Encounter (HOSPITAL_BASED_OUTPATIENT_CLINIC_OR_DEPARTMENT_OTHER): Payer: Self-pay | Admitting: *Deleted

## 2014-05-29 ENCOUNTER — Emergency Department (HOSPITAL_BASED_OUTPATIENT_CLINIC_OR_DEPARTMENT_OTHER)
Admission: EM | Admit: 2014-05-29 | Discharge: 2014-05-29 | Disposition: A | Discharge status: Home / Self Care | Payer: Medicaid Other | Attending: Emergency Medicine | Admitting: Emergency Medicine

## 2014-05-29 DIAGNOSIS — M5442 Lumbago with sciatica, left side: Secondary | ICD-10-CM

## 2014-05-29 DIAGNOSIS — Z87442 Personal history of urinary calculi: Secondary | ICD-10-CM | POA: Insufficient documentation

## 2014-05-29 DIAGNOSIS — G8929 Other chronic pain: Secondary | ICD-10-CM | POA: Insufficient documentation

## 2014-05-29 DIAGNOSIS — Z87448 Personal history of other diseases of urinary system: Secondary | ICD-10-CM | POA: Insufficient documentation

## 2014-05-29 DIAGNOSIS — Z720 Tobacco use: Secondary | ICD-10-CM | POA: Insufficient documentation

## 2014-05-29 MED ORDER — HYDROCODONE-ACETAMINOPHEN 5-325 MG PO TABS
2.0000 | ORAL_TABLET | Freq: Once | ORAL | Status: AC
  Administered 2014-05-29: 2 via ORAL
  Filled 2014-05-29: qty 2

## 2014-05-29 MED ORDER — CYCLOBENZAPRINE HCL 10 MG PO TABS: 10.0000 mg | ORAL_TABLET | Freq: Two times a day (BID) | ORAL | Status: DC | PRN

## 2014-05-29 MED ORDER — IBUPROFEN 800 MG PO TABS: 800.0000 mg | ORAL_TABLET | Freq: Three times a day (TID) | ORAL | Status: DC | PRN

## 2014-05-29 NOTE — Discharge Instructions (Signed)
Back Exercises Back exercises help treat and prevent back injuries. The goal of back exercises is to increase the strength of your abdominal and back muscles and the flexibility of your back. These exercises should be started when you no longer have back pain. Back exercises include:  Pelvic Tilt. Lie on your back with your knees bent. Tilt your pelvis until the lower part of your back is against the floor. Hold this position 5 to 10 sec and repeat 5 to 10 times.  Knee to Chest. Pull first 1 knee up against your chest and hold for 20 to 30 seconds, repeat this with the other knee, and then both knees. This may be done with the other leg straight or bent, whichever feels better.  Sit-Ups or Curl-Ups. Bend your knees 90 degrees. Start with tilting your pelvis, and do a partial, slow sit-up, lifting your trunk only 30 to 45 degrees off the floor. Take at least 2 to 3 seconds for each sit-up. Do not do sit-ups with your knees out straight. If partial sit-ups are difficult, simply do the above but with only tightening your abdominal muscles and holding it as directed.  Hip-Lift. Lie on your back with your knees flexed 90 degrees. Push down with your feet and shoulders as you raise your hips a couple inches off the floor; hold for 10 seconds, repeat 5 to 10 times.  Back arches. Lie on your stomach, propping yourself up on bent elbows. Slowly press on your hands, causing an arch in your low back. Repeat 3 to 5 times. Any initial stiffness and discomfort should lessen with repetition over time.  Shoulder-Lifts. Lie face down with arms beside your body. Keep hips and torso pressed to floor as you slowly lift your head and shoulders off the floor. Do not overdo your exercises, especially in the beginning. Exercises may cause you some mild back discomfort which lasts for a few minutes; however, if the pain is more severe, or lasts for more than 15 minutes, do not continue exercises until you see your caregiver.  Improvement with exercise therapy for back problems is slow.  See your caregivers for assistance with developing a proper back exercise program. Document Released: 02/27/2004 Document Revised: 04/13/2011 Document Reviewed: 11/20/2010 [facility] Patient Information 2015 [employer]. This information is not intended to replace advice given to you by your health care provider. Make sure you discuss any questions you have with your health care provider.  Please use prescribed medications as indicated. Follow instructions as stated above. Refer worsening signs or symptoms, [facility] and management if symptoms persist or worsen.

## 2014-05-29 NOTE — ED Notes (Addendum)
At work bent down to pick something up and felt a pop in his lower back. Pain radiates down his left leg. Drove himself here.

## 2014-05-29 NOTE — ED Provider Notes (Signed)
CSN: [id number]     Arrival date & time 05/29/14  1207 History   First MD Initiated Contact with Patient 05/29/14 1415     Chief Complaint  Patient presents with  . Back Pain   HPI   34 year old male presents with back pain. 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 He states the sensation was excruciating to the point he was barely able to walk. Patient reports a chronic history of back pain for which he's been seen by pain management, describes this as similar in nature but worse. He describes radiation is episodic with movement of his back, radiation stops at the knee. He denies bladder or bowel incontinence or incontinence, saddle anesthesia, or loss of distal sensation strength or function. Patient is able to ambulate but with difficulty. No recent history of trauma to the back, no surgeries. Patient reports that he does not have a primary care provider as he let his insurance lapse, not currently taking any medications. Has not tried any over-the-counter therapies as he came straight from work.  Past Medical History  Diagnosis Date  . Degenerative joint disease of low back   . Renal disorder   . Kidney stones    History reviewed. No pertinent past surgical history. No family history on file. History  Substance Use Topics  . Smoking status: Current Every Day Smoker -- 1.00 packs/day    Types: Cigarettes  . Smokeless tobacco: Not on file  . Alcohol Use: Yes    Review of Systems  All other systems reviewed and are negative.  Allergies  Review of patient's allergies indicates no known allergies.  Home Medications   Prior to Admission medications   Medication Sig Start Date End Date Taking? Authorizing Provider  cyclobenzaprine (FLEXERIL) 10 MG tablet Take 1 tablet (10 mg total) by mouth 2 (two) times daily as needed for muscle spasms. 05/29/14    Eyvonne MechanicJeffrey Macie Baum, PA-C  ibuprofen (ADVIL,MOTRIN) 800 MG tablet Take 1 tablet (800 mg total) by mouth every 8 (eight) hours as needed. 05/29/14   Tinnie GensJeffrey Altair Stanko, PA-C  ondansetron (ZOFRAN) 4 MG tablet Take 1 tablet (4 mg total) by mouth every 6 (six) hours. 05/08/14   Linwood DibblesJon Knapp, MD  oxyCODONE-acetaminophen (PERCOCET/ROXICET) 5-325 MG per tablet Take 1-2 tablets by mouth every 8 (eight) hours as needed for moderate pain or severe pain. 05/08/14   Linwood DibblesJon Knapp, MD   BP 124/72 mmHg  Pulse 80  Temp(Src) 98.2 F (36.8 C) (Oral)  Resp 16  Ht 5\' 5"  (1.651 m)  Wt 150 lb (68.04 kg)  BMI 24.96 kg/m2  SpO2 100% Physical Exam  Constitutional: He is oriented to person, place, and time. He appears well-developed and well-nourished.  HENT:  Head: Normocephalic and atraumatic.  Eyes: Conjunctivae are normal. Pupils are equal, round, and reactive to light. Right eye exhibits no discharge. Left eye exhibits no discharge. No scleral icterus.  Neck: Normal range of motion. No JVD present. No tracheal deviation present.  Cardiovascular: Normal rate and regular rhythm.   Pulmonary/Chest: Effort normal. No stridor.  Musculoskeletal:  No CT or L-spine tenderness, no tenderness at the SI or hip. Left lateral tenderness to palpation of paraspinal muscles, spasm noted. No sign of trauma or surgical incisions. Straight leg raise left positive. Sensation perfusion range of motion intact to heel knee ankle. Decreased strength with left extremity  due to pain. Patellar reflex is intact +2  Neurological: He is alert and oriented to person, place, and time. Coordination normal.  Psychiatric: He has a normal mood and affect. His behavior is normal. Judgment and thought content normal.  Nursing note and vitals reviewed.   ED Course  Procedures (including critical care time) Labs Review Labs Reviewed - No data to display  Imaging Review No results found.   EKG Interpretation None     MDM   Final diagnoses:  Left-sided  low back pain with left-sided sciatica   34 year old male presents with acute on chronic back pain. Patient is a young healthy male with no significant trauma to the back that would indicate further diagnostic imaging. Patient denied urinary or bowel retention or incontinence, saddle anesthesia, distal  Neurological signs or symptoms in addition to the episodic sciatica. Patient had not tried any over-the-counter medications at time of presentation, he was able to ambulate, but with pain.He was treated in the ED with oral hydrocodone and discharged home with flexeril and ibuprofen. He was instructed to use the ibuprofen, rest, ice, and complete back exercises as tolerated. He was given strict follow up instructions and contact information for [facility] wellness. Pt reported that his significant other would be picking him up. He was advised not to drink, drive, or operate heavy machinery today. Pt understood and agreed to the plan.     Eyvonne Mechanic, PA-C 05/29/14 2355  Zadie Rhine, MD 05/30/14 [phone]

## 2014-11-11 ENCOUNTER — Emergency Department (HOSPITAL_BASED_OUTPATIENT_CLINIC_OR_DEPARTMENT_OTHER)
Admission: EM | Admit: 2014-11-11 | Discharge: 2014-11-11 | Disposition: A | Discharge status: Home / Self Care | Payer: Medicaid Other | Attending: Emergency Medicine | Admitting: Emergency Medicine

## 2014-11-11 DIAGNOSIS — Z87448 Personal history of other diseases of urinary system: Secondary | ICD-10-CM | POA: Insufficient documentation

## 2014-11-11 DIAGNOSIS — Z87442 Personal history of urinary calculi: Secondary | ICD-10-CM | POA: Insufficient documentation

## 2014-11-11 DIAGNOSIS — Y9289 Other specified places as the place of occurrence of the external cause: Secondary | ICD-10-CM | POA: Insufficient documentation

## 2014-11-11 DIAGNOSIS — X58XXXA Exposure to other specified factors, initial encounter: Secondary | ICD-10-CM | POA: Insufficient documentation

## 2014-11-11 DIAGNOSIS — Y998 Other external cause status: Secondary | ICD-10-CM | POA: Insufficient documentation

## 2014-11-11 DIAGNOSIS — Z720 Tobacco use: Secondary | ICD-10-CM | POA: Insufficient documentation

## 2014-11-11 DIAGNOSIS — M79604 Pain in right leg: Secondary | ICD-10-CM

## 2014-11-11 DIAGNOSIS — Z8739 Personal history of other diseases of the musculoskeletal system and connective tissue: Secondary | ICD-10-CM | POA: Insufficient documentation

## 2014-11-11 DIAGNOSIS — Y9389 Activity, other specified: Secondary | ICD-10-CM | POA: Insufficient documentation

## 2014-11-11 DIAGNOSIS — S8991XA Unspecified injury of right lower leg, initial encounter: Secondary | ICD-10-CM | POA: Insufficient documentation

## 2014-11-11 MED ORDER — IBUPROFEN 800 MG PO TABS
800.0000 mg | ORAL_TABLET | Freq: Once | ORAL | Status: AC
  Administered 2014-11-11: 800 mg via ORAL
  Filled 2014-11-11: qty 1

## 2014-11-11 NOTE — ED Provider Notes (Signed)
CSN: [id number]     Arrival date & time 11/11/14  1242 History   First MD Initiated Contact with Patient 11/11/14 1323     Chief Complaint  Patient presents with  . Leg Pain     (Consider location/radiation/quality/duration/timing/severity/associated sxs/prior Treatment) HPI Samuel Vargas is a 34 y.o. male who comes in for evaluation of right leg pain. Patient states yesterday he was changing a tire on his truck, slipped on the truck and experienced sudden onset right posterior leg pain around his calf. Rates discomfort as 5/10 at rest, exacerbated with movement and walking. Has not tried anything to improve symptoms. Denies fevers, chills, numbness or weakness. No other aggravating or modifying factors.  Past Medical History  Diagnosis Date  . Degenerative joint disease of low back   . Renal disorder   . Kidney stones    No past surgical history on file. No family history on file. Social History  Substance Use Topics  . Smoking status: Current Every Day Smoker -- 1.00 packs/day    Types: Cigarettes  . Smokeless tobacco: Not on file  . Alcohol Use: Yes    Review of Systems A 10 point review of systems was completed and was negative except for pertinent positives and negatives as mentioned in the history of present illness     Allergies  Review of patient's allergies indicates no known allergies.  Home Medications   Prior to Admission medications   Medication Sig Start Date End Date Taking? Authorizing Provider  cyclobenzaprine (FLEXERIL) 10 MG tablet Take 1 tablet (10 mg total) by mouth 2 (two) times daily as needed for muscle spasms. 05/29/14   Eyvonne Mechanic, PA-C  ibuprofen (ADVIL,MOTRIN) 800 MG tablet Take 1 tablet (800 mg total) by mouth every 8 (eight) hours as needed. 05/29/14   Tinnie Gens Hedges, PA-C  ondansetron (ZOFRAN) 4 MG tablet Take 1 tablet (4 mg total) by mouth every 6 (six) hours. 05/08/14   Linwood Dibbles, MD  oxyCODONE-acetaminophen (PERCOCET/ROXICET) 5-325 MG  per tablet Take 1-2 tablets by mouth every 8 (eight) hours as needed for moderate pain or severe pain. 05/08/14   Linwood Dibbles, MD   BP 134/78 mmHg  Pulse 55  Temp(Src) 98 F (36.7 C) (Oral)  Resp 18  Ht  (1.651 m)  Wt 150 lb (68.04 kg)  BMI 24.96 kg/m2  SpO2 100% Physical Exam  Constitutional:  Awake, alert, nontoxic appearance.  HENT:  Head: Atraumatic.  Eyes: Right eye exhibits no discharge. Left eye exhibits no discharge.  Neck: Neck supple.  Pulmonary/Chest: Effort normal. He exhibits no tenderness.  Abdominal: Soft. There is no tenderness. There is no rebound.  Musculoskeletal: He exhibits no tenderness.  Baseline ROM, no obvious new focal weakness. No focal bony tenderness. Tenderness to palpation at origin of calf muscle. Most components are soft. Distal pulses are intact. No edema or erythema noted. No other lesions or deformities. Full active range of motion of right hip and ankle.  Neurological:  Mental status and motor strength appears baseline for patient and situation.  Skin: No rash noted.  Psychiatric: He has a normal mood and affect.  Nursing note and vitals reviewed.   ED Course  Procedures (including critical care time) Labs Review Labs Reviewed - No data to display  Imaging Review No results found. I have personally reviewed and evaluated these images and lab results as part of my medical decision-making.   EKG Interpretation None     Meds given in ED:  Medications  ibuprofen (ADVIL,MOTRIN) tablet 800 mg (800 mg Oral Given 11/11/14 1425)    Discharge Medication List as of 11/11/2014  1:59 PM     Filed Vitals:   11/11/14 1254 11/11/14 1425  BP: 156/90 134/78  Pulse: 79 55  Temp: 98 F (36.7 C)   TempSrc: Oral   Resp: 20 18  Height:  (1.651 m)   Weight: 150 lb (68.04 kg)   SpO2: 100% 100%    MDM  Samuel Vargas is a 34 y.o. male who presents to the ED for right leg pain after slipping on his truck changing a tire. Patient  without any overt trauma to the knee or leg. Suspect patient hyperextended left leg. Tenderness diffusely around calf insertion site. Muscle compartment soft. No focal bony tenderness on the. No obvious joint effusion. No erythema. Low suspicion for septic joint, hemarthrosis. Patient maintains full active range of motion of knee. Neurovascularly intact Will treat with anti-inflammatory's, Ace wrap for comfort. Follow up with PCP in one week as needed for reevaluation. No evidence of other acute or emergent pathology at this time. Overall, patient appears well, nontoxic and is appropriate for discharge. Final diagnoses:  Right leg pain        Joycie Peek, PA-C 11/11/14 2344  Lyndal Pulley, MD 11/12/14 [phone]

## 2014-11-11 NOTE — Discharge Instructions (Signed)
You were evaluated in the ED today for your leg pain. Your likely experiencing a strain of your calf muscle. He may continue taking Motrin for your discomfort. Use your Ace wrap as needed for comfort. Follow-up with your doctor as needed. Return to ED for worsening symptoms.  Musculoskeletal Pain Musculoskeletal pain is muscle and boney aches and pains. These pains can occur in any part of the body. Your caregiver may treat you without knowing the cause of the pain. They may treat you if blood or urine tests, X-rays, and other tests were normal.  CAUSES There is often not a definite cause or reason for these pains. These pains may be caused by a type of germ (virus). The discomfort may also come from overuse. Overuse includes working out too hard when your body is not fit. Boney aches also come from weather changes. Bone is sensitive to atmospheric pressure changes. HOME CARE INSTRUCTIONS   Ask when your test results will be ready. Make sure you get your test results.  Only take over-the-counter or prescription medicines for pain, discomfort, or fever as directed by your caregiver. If you were given medications for your condition, do not drive, operate machinery or power tools, or sign legal documents for 24 hours. Do not drink alcohol. Do not take sleeping pills or other medications that may interfere with treatment.  Continue all activities unless the activities cause more pain. When the pain lessens, slowly resume normal activities. Gradually increase the intensity and duration of the activities or exercise.  During periods of severe pain, bed rest may be helpful. Lay or sit in any position that is comfortable.  Putting ice on the injured area.  Put ice in a bag.  Place a towel between your skin and the bag.  Leave the ice on for 15 to 20 minutes, 3 to 4 times a day.  Follow up with your caregiver for continued problems and no reason can be found for the pain. If the pain becomes worse or  does not go away, it may be necessary to repeat tests or do additional testing. Your caregiver may need to look further for a possible cause. SEEK IMMEDIATE MEDICAL CARE IF:  You have pain that is getting worse and is not relieved by medications.  You develop chest pain that is associated with shortness or breath, sweating, feeling sick to your stomach (nauseous), or throw up (vomit).  Your pain becomes localized to the abdomen.  You develop any new symptoms that seem different or that concern you. MAKE SURE YOU:   Understand these instructions.  Will watch your condition.  Will get help right away if you are not doing well or get worse.   This information is not intended to replace advice given to you by your health care provider. Make sure you discuss any questions you have with your health care provider.   Document Released: 01/19/2005 Document Revised: 04/13/2011 Document Reviewed: 09/23/2012 Elsevier Interactive Patient Education [employer]

## 2014-11-11 NOTE — ED Notes (Signed)
Knee pain to right knee

## 2015-10-07 ENCOUNTER — Emergency Department (HOSPITAL_COMMUNITY)
Admission: EM | Admit: 2015-10-07 | Discharge: 2015-10-07 | Disposition: A | Discharge status: Home / Self Care | Payer: Medicaid Other | Attending: Emergency Medicine | Admitting: Emergency Medicine

## 2015-10-07 ENCOUNTER — Encounter (HOSPITAL_COMMUNITY): Payer: Self-pay | Admitting: Emergency Medicine

## 2015-10-07 DIAGNOSIS — Z79899 Other long term (current) drug therapy: Secondary | ICD-10-CM | POA: Insufficient documentation

## 2015-10-07 DIAGNOSIS — K4090 Unilateral inguinal hernia, without obstruction or gangrene, not specified as recurrent: Secondary | ICD-10-CM

## 2015-10-07 DIAGNOSIS — F17210 Nicotine dependence, cigarettes, uncomplicated: Secondary | ICD-10-CM | POA: Insufficient documentation

## 2015-10-07 HISTORY — DX: Low back pain, unspecified: M54.50

## 2015-10-07 HISTORY — DX: Low back pain: M54.5

## 2015-10-07 MED ORDER — KETOROLAC TROMETHAMINE 60 MG/2ML IM SOLN
60.0000 mg | Freq: Once | INTRAMUSCULAR | Status: AC
  Administered 2015-10-07: 60 mg via INTRAMUSCULAR
  Filled 2015-10-07: qty 2

## 2015-10-07 MED ORDER — MORPHINE SULFATE 15 MG PO TABS: 15.0000 mg | ORAL_TABLET | ORAL | 0 refills | Status: DC | PRN

## 2015-10-07 MED ORDER — ACETAMINOPHEN 500 MG PO TABS
1000.0000 mg | ORAL_TABLET | Freq: Once | ORAL | Status: AC
  Administered 2015-10-07: 1000 mg via ORAL
  Filled 2015-10-07: qty 2

## 2015-10-07 MED ORDER — OXYCODONE HCL 5 MG PO TABS
5.0000 mg | ORAL_TABLET | Freq: Once | ORAL | Status: AC
  Administered 2015-10-07: 5 mg via ORAL
  Filled 2015-10-07: qty 1

## 2015-10-07 NOTE — Discharge Instructions (Signed)

## 2015-10-07 NOTE — ED Provider Notes (Signed)
[facility]-EMERGENCY DEPT Provider Note   CSN: [id number] Arrival date & time: 10/07/15  1218     History   Chief Complaint Chief Complaint  Patient presents with  . Groin Pain    HPI Samuel Vargas is a 35 y.o. male.  35 yo M with a chief complaint of a painful right internal hernia. Patient has been seen for this before referred to surgery at [facility]. Patient continued to have symptoms over the past couple days. Still having bowel movements denies vomiting. Complaining of severe pain. Denies testicular involvement denies dysuria. Pain is sharp throbbing. Worse with palpation standing coughing.   The history is provided by the patient.  Groin Pain  This is a recurrent problem. The current episode started yesterday. The problem occurs constantly. The problem has not changed since onset.Pertinent negatives include no chest pain, no abdominal pain, no headaches and no shortness of breath. The symptoms are aggravated by walking, sneezing and coughing. Nothing relieves the symptoms. He has tried nothing for the symptoms. The treatment provided no relief.    Past Medical History:  Diagnosis Date  . Degenerative joint disease of low back   . Kidney stones   . Lower back pain   . Renal disorder     There are no active problems to display for this patient.   History reviewed. No pertinent surgical history.     Home Medications    Prior to Admission medications   Medication Sig Start Date End Date Taking? Authorizing Provider  cyclobenzaprine (FLEXERIL) 10 MG tablet Take 1 tablet (10 mg total) by mouth 2 (two) times daily as needed for muscle spasms. 05/29/14   Eyvonne MechanicJeffrey Hedges, PA-C  ibuprofen (ADVIL,MOTRIN) 800 MG tablet Take 1 tablet (800 mg total) by mouth every 8 (eight) hours as needed. 05/29/14   Eyvonne MechanicJeffrey Hedges, PA-C  morphine (MSIR) 15 MG tablet Take 1 tablet (15 mg total) by mouth every 4 (four) hours as needed for severe pain. 10/07/15   Melene Planan Lavenia Stumpo, DO  ondansetron (ZOFRAN) 4  MG tablet Take 1 tablet (4 mg total) by mouth every 6 (six) hours. 05/08/14   Linwood DibblesJon Knapp, MD  oxyCODONE-acetaminophen (PERCOCET/ROXICET) 5-325 MG per tablet Take 1-2 tablets by mouth every 8 (eight) hours as needed for moderate pain or severe pain. 05/08/14   Linwood DibblesJon Knapp, MD    Family History No family history on file.  Social History Social History  Substance Use Topics  . Smoking status: Current Every Day Smoker    Packs/day: 1.00    Types: Cigarettes  . Smokeless tobacco: Not on file  . Alcohol use Yes     Allergies   Review of patient's allergies indicates no known allergies.   Review of Systems Review of Systems  Constitutional: Negative for chills and fever.  HENT: Negative for congestion and facial swelling.   Eyes: Negative for discharge and visual disturbance.  Respiratory: Negative for shortness of breath.   Cardiovascular: Negative for chest pain and palpitations.  Gastrointestinal: Negative for abdominal pain, diarrhea and vomiting.       Right inguinal mass  Musculoskeletal: Negative for arthralgias and myalgias.  Skin: Negative for color change and rash.  Neurological: Negative for tremors, syncope and headaches.  Psychiatric/Behavioral: Negative for confusion and dysphoric mood.     Physical Exam Updated Vital Signs BP 148/77 (BP Location: Left Arm)   Pulse 84   Temp 98.7 F (37.1 C) (Oral)   Resp 16   Ht 5\' 5"  (1.651 m)   Wt  145 lb (65.8 kg)   SpO2 100%   BMI 24.13 kg/m   Physical Exam  Constitutional: He is oriented to person, place, and time. He appears well-developed and well-nourished.  HENT:  Head: Normocephalic and atraumatic.  Eyes: Conjunctivae and EOM are normal. Pupils are equal, round, and reactive to light.  Neck: Normal range of motion. No JVD present.  Cardiovascular: Normal rate and regular rhythm.   Pulmonary/Chest: Effort normal. No stridor. No respiratory distress.  Abdominal: He exhibits mass (R direct inguinal hernia, soft, no  noted skin changes). He exhibits no distension. There is no tenderness. There is no guarding.  Musculoskeletal: Normal range of motion. He exhibits no edema.  Neurological: He is alert and oriented to person, place, and time.  Skin: Skin is warm and dry.  Psychiatric: He has a normal mood and affect. His behavior is normal.     ED Treatments / Results  Labs (all labs ordered are listed, but only abnormal results are displayed) Labs Reviewed - No data to display  EKG  EKG Interpretation None       Radiology No results found.  Procedures Hernia reduction Date/Time: 10/07/2015 3:10 PM Performed by: Adela Lank Wendi Lastra Authorized by: Melene Plan  Consent: Verbal consent obtained. Risks and benefits: risks, benefits and alternatives were discussed Consent given by: patient Patient understanding: patient states understanding of the procedure being performed Patient consent: the patient's understanding of the procedure matches consent given Required items: required blood products, implants, devices, and special equipment available Time out: Immediately prior to procedure a "time out" was called to verify the correct patient, procedure, equipment, support staff and site/side marked as required. Local anesthesia used: no  Anesthesia: Local anesthesia used: no  Sedation: Patient sedated: no Patient tolerance: Patient tolerated the procedure well with no immediate complications    (including critical care time)  Medications Ordered in ED Medications  acetaminophen (TYLENOL) tablet 1,000 mg (1,000 mg Oral Given 10/07/15 1402)  ketorolac (TORADOL) injection 60 mg (60 mg Intramuscular Given 10/07/15 1402)  oxyCODONE (Oxy IR/ROXICODONE) immediate release tablet 5 mg (5 mg Oral Given 10/07/15 1402)     Initial Impression / Assessment and Plan / ED Course  I have reviewed the triage vital signs and the nursing notes.  Pertinent labs & imaging results that were available during my care of the  patient were reviewed by me and considered in my medical decision making (see chart for details).  Clinical Course    35 yo M with R direct inguinal hernia.  Easily reduced at bedside.  Surgery follow up.   3:13 PM:  I have discussed the diagnosis/risks/treatment options with the patient and believe the pt to be eligible for discharge home to follow-up with Gen surgery. We also discussed returning to the ED immediately if new or worsening sx occur. We discussed the sx which are most concerning (e.g., sudden worsening pain, fever, inability to tolerate by mouth) that necessitate immediate return. Medications administered to the patient during their visit and any new prescriptions provided to the patient are listed below.  Medications given during this visit Medications  acetaminophen (TYLENOL) tablet 1,000 mg (1,000 mg Oral Given 10/07/15 1402)  ketorolac (TORADOL) injection 60 mg (60 mg Intramuscular Given 10/07/15 1402)  oxyCODONE (Oxy IR/ROXICODONE) immediate release tablet 5 mg (5 mg Oral Given 10/07/15 1402)     The patient appears reasonably screen and/or stabilized for discharge and I doubt any other medical condition or other [facility] requiring further screening, evaluation, or treatment  in the ED at this time prior to discharge.    Final Clinical Impressions(s) / ED Diagnoses   Final diagnoses:  Unilateral inguinal hernia without obstruction or gangrene, recurrence not specified    New Prescriptions Discharge Medication List as of 10/07/2015  1:59 PM    START taking these medications   Details  morphine (MSIR) 15 MG tablet Take 1 tablet (15 mg total) by mouth every 4 (four) hours as needed for severe pain., Starting Mon 10/07/2015, Print         Melene Plan, DO 10/07/15 [phone]

## 2015-10-07 NOTE — ED Triage Notes (Signed)
Pt reports right groin pain "possible hernia" worsening over past week. Pt reports painful to touch and "egg" sized. Pt denies n/v/d.

## 2015-10-30 ENCOUNTER — Ambulatory Visit: Payer: Self-pay | Admitting: Surgery

## 2015-10-30 NOTE — H&P (Signed)
Samuel Vargas 10/30/2015 2:35 PM Location: [facility] Patient #: [id number] DOB: [birth year] Single / Language: [place] / Race: White Male  History of Present Illness (Neilah Fulwider A. Fredricka Bonineonnor MD; 10/30/2015 2:47 PM) Patient words: This is a very pleasant 35yo man with complaints of a right-sided inguinal hernia. He first noticed it in June or July and feels it has increased in size slightly, it causes him significant discomfort but he denies any nausea, vomiting, change in bowel function or change in urinary function. He does note that when he pushes on it the skin feels slightly numb. It is usually reducible. He desires repair as he works as a Designer, fashion/clothingroofer and the pain from the hernia is a significant hindrance to his performance at work. He is otherwise healthy except for smoking cigarettes, which he understands the need to quit.  The patient is a 35 year old male.   Other Problems [name]([name], CMA; 10/30/2015 2:35 PM) Anxiety Disorder Back Pain Depression Inguinal Hernia Kidney Stone  Past Surgical History [name]([name], CMA; 10/30/2015 2:35 PM) No pertinent past surgical history  Diagnostic Studies History [name]([name], CMA; 10/30/2015 2:35 PM) Colonoscopy never  Allergies [name]([name], CMA; 10/30/2015 2:36 PM) No Known Drug Allergies 10/30/2015  Medication History [name]([name], CMA; 10/30/2015 2:36 PM) No Current Medications Medications Reconciled  Social History [name]([name], CMA; 10/30/2015 2:35 PM) Alcohol use Occasional alcohol use. Caffeine use Carbonated beverages, Coffee, Tea. Illicit drug use Prefer to discuss with provider. Tobacco use Current every day smoker.  Family History [name]([name], CMA; 10/30/2015 2:35 PM) Alcohol Abuse Mother. Diabetes Mellitus Brother. Hypertension Mother.     Review of Systems [name]([name] CMA; 10/30/2015 2:35 PM) General Present- Night Sweats. Not Present- Appetite Loss, Chills, Fatigue, Fever, Weight Gain and  Weight Loss. Skin Not Present- Change in Wart/Mole, Dryness, Hives, Jaundice, New Lesions, Non-Healing Wounds, Rash and Ulcer. HEENT Not Present- Earache, Hearing Loss, Hoarseness, Nose Bleed, Oral Ulcers, Ringing in the Ears, Seasonal Allergies, Sinus Pain, Sore Throat, Visual Disturbances, Wears glasses/contact lenses and Yellow Eyes. Respiratory Present- Snoring and Wheezing. Not Present- Bloody sputum, Chronic Cough and Difficulty Breathing. Breast Not Present- Breast Mass, Breast Pain, Nipple Discharge and Skin Changes. Cardiovascular Not Present- Chest Pain, Difficulty Breathing Lying Down, Leg Cramps, Palpitations, Rapid Heart Rate, Shortness of Breath and Swelling of Extremities. Gastrointestinal Not Present- Abdominal Pain, Bloating, Bloody Stool, Change in Bowel Habits, Chronic diarrhea, Constipation, Difficulty Swallowing, Excessive gas, Gets full quickly at meals, Hemorrhoids, Indigestion, Nausea, Rectal Pain and Vomiting. Male Genitourinary Not Present- Blood in Urine, Change in Urinary Stream, Frequency, Impotence, Nocturia, Painful Urination, Urgency and Urine Leakage. Musculoskeletal Present- Back Pain, Joint Pain and Joint Stiffness. Not Present- Muscle Pain, Muscle Weakness and Swelling of Extremities. Neurological Present- Numbness and Tingling. Not Present- Decreased Memory, Fainting, Headaches, Seizures, Tremor, Trouble walking and Weakness. Psychiatric Present- Anxiety. Not Present- Bipolar, Change in Sleep Pattern, Depression, Fearful and Frequent crying. Endocrine Not Present- Cold Intolerance, Excessive Hunger, Hair Changes, Heat Intolerance, Hot flashes and New Diabetes. Hematology Not Present- Blood Thinners, Easy Bruising, Excessive bleeding, Gland problems, HIV and Persistent Infections.  Vitals ([name] CMA; 10/30/2015 2:36 PM) 10/30/2015 2:35 PM Weight: 149 lb Height: 65in Body Surface Area: 1.75 m Body Mass Index: 24.79 kg/m  Temp.: 15F(Temporal)   Pulse: 79 (Regular)  BP: 128/84 (Sitting, Left Arm, Standard)      Physical Exam (Jullie Arps A. Fredricka Bonineonnor MD; 10/30/2015 2:50 PM)  The physical exam findings are as follows: Note:Alert, oriented, no acute  distress Extraocular movements intact, pupils equally round and reactive, anicteric, moist mucus membranes Neck without mass or thyromegaly Unlabored resp, CTAB, RRR palpable distal pulses Abdomen soft, nontender, nondistended Reducible right inguinal hernia. No hernia on the left Ext WWP Neuro grossly normal Psych normal mood and affect    Assessment & Plan (Micaela Stith A. Fredricka Bonine MD; 10/30/2015 2:52 PM)  RIGHT INGUINAL HERNIA (Principal Diagnosis) (K40.90) Story: We discussed the natural history of hernia and the process of open inguinal hernia repair with mesh. Discussed risk of injury to adjacent structures and sequelae, although low risk. Discussed importance of smoking cessation. Will plan for open right inguinal hernia repair with mesh in the coming weeks  Current Plans You are being scheduled for surgery - Our schedulers will call you.  You should hear from our office's scheduling department within 5 working days about the location, date, and time of surgery. We try to make accommodations for patient's preferences in scheduling surgery, but sometimes the OR schedule or the surgeon's schedule prevents [place] from making those accommodations.  If you have not heard from our office [phone]) in 5 working days, call the office and ask for your surgeon's nurse.  If you have other questions about your diagnosis, plan, or surgery, call the office and ask for your surgeon's nurse.  Pt Education - Pamphlet Given - Hernia Surgery: discussed with patient and provided information.

## 2015-12-16 ENCOUNTER — Encounter (HOSPITAL_COMMUNITY)
Admission: RE | Admit: 2015-12-16 | Discharge: 2015-12-16 | Disposition: A | Discharge status: Home / Self Care | Payer: Medicaid Other | Source: Ambulatory Visit | Attending: Surgery | Admitting: Surgery

## 2015-12-16 ENCOUNTER — Encounter (HOSPITAL_COMMUNITY): Payer: Self-pay

## 2015-12-16 DIAGNOSIS — Z01818 Encounter for other preprocedural examination: Secondary | ICD-10-CM | POA: Insufficient documentation

## 2015-12-16 HISTORY — DX: Personal history of urinary calculi: Z87.442

## 2015-12-16 HISTORY — DX: Cardiac murmur, unspecified: R01.1

## 2015-12-16 HISTORY — DX: Personal history of other specified conditions: Z87.898

## 2015-12-16 LAB — CBC
HCT: 47.2 % (ref 39.0–52.0)
Hemoglobin: 15.6 g/dL (ref 13.0–17.0)
MCH: 30.8 pg (ref 26.0–34.0)
MCHC: 33.1 g/dL (ref 30.0–36.0)
MCV: 93.1 fL (ref 78.0–100.0)
PLATELETS: 400 10*3/uL (ref 150–400)
RBC: 5.07 MIL/uL (ref 4.22–5.81)
RDW: 14.2 % (ref 11.5–15.5)
WBC: 9.8 10*3/uL (ref 4.0–10.5)

## 2015-12-16 NOTE — Patient Instructions (Addendum)
Samuel BeltonRichard J Vargas  12/16/2015   Your procedure is scheduled on: 12-18-15   [facility]  [facility] to 3rd floor to  Short Stay Center at  0800  AM.  Call this number if you have problems the morning of surgery [phone]   Remember: ONLY 1 PERSON MAY GO WITH YOU TO SHORT STAY TO GET  READY MORNING OF YOUR SURGERY.  Do not eat food or drink liquids :After Midnight.     Take these medicines the morning of surgery with A SIP OF WATER: Use Albuterol Inhaler AM of- as needed DO NOT TAKE ANY DIABETIC MEDICATIONS DAY OF YOUR SURGERY                               You may not have any metal on your body including hair pins and              piercings  Do not wear jewelry, make-up, lotions, powders or perfumes, deodorant             Do not wear nail polish.  Do not shave  48 hours prior to surgery.              Men may shave face and neck.   Do not bring valuables to the hospital. [facility] IS NOT             RESPONSIBLE   FOR VALUABLES.  Contacts, dentures or bridgework may not be worn into surgery.  Leave suitcase in the car. After surgery it may be brought to your room.     Patients discharged the day of surgery will not be allowed to drive home.  Name and phone number of your driver: Lamar Sprinklesngie -fiancee [phone] cell  Special Instructions: N/A              Please read over the following fact sheets you were given: _____________________________________________________________________             [facility] - Preparing for [facility] Before surgery, you can play an important role.  Because skin is not sterile, your skin needs to be as free of germs as possible.  You can reduce the number of germs on your skin by washing with CHG (chlorahexidine gluconate) soap before surgery.  CHG is an antiseptic cleaner which kills germs and bonds with the skin to continue killing germs even after washing. Please DO NOT use if you have an  allergy to CHG or antibacterial soaps.  If your skin becomes reddened/irritated stop using the CHG and inform your nurse when you arrive at Short Stay. Do not shave (including legs and underarms) for at least 48 hours prior to the first CHG shower.  You may shave your face/neck. Please follow these instructions carefully:  1.  Shower with CHG Soap the night before surgery and the  morning of Surgery.  2.  If you choose to wash your hair, wash your hair first as usual with your  normal  shampoo.  3.  After you shampoo, rinse your hair and body thoroughly to remove the  shampoo.                           4.  Use CHG as you would any other liquid soap.  You can apply chg directly  to the skin and wash                       Gently with a scrungie or clean washcloth.  5.  Apply the CHG Soap to your body ONLY FROM THE NECK DOWN.   Do not use on face/ open                           Wound or open sores. Avoid contact with eyes, ears mouth and genitals (private parts).                       Wash face,  Genitals (private parts) with your normal soap.             6.  Wash thoroughly, paying special attention to the area where your surgery  will be performed.  7.  Thoroughly rinse your body with warm water from the neck down.  8.  DO NOT shower/wash with your normal soap after using and rinsing off  the CHG Soap.                9.  Pat yourself dry with a clean towel.            10.  Wear clean pajamas.            11.  Place clean sheets on your bed the night of your first shower and do not  sleep with pets. Day of Surgery : Do not apply any lotions/deodorants the morning of surgery.  Please wear clean clothes to the hospital/surgery center.  FAILURE TO FOLLOW THESE INSTRUCTIONS MAY RESULT IN THE CANCELLATION OF YOUR SURGERY PATIENT SIGNATURE_________________________________  NURSE SIGNATURE__________________________________  ________________________________________________________________________

## 2015-12-18 ENCOUNTER — Ambulatory Visit (HOSPITAL_COMMUNITY): Payer: Self-pay | Admitting: Certified Registered"

## 2015-12-18 ENCOUNTER — Encounter (HOSPITAL_COMMUNITY)
Admission: RE | Disposition: A | Discharge status: Home / Self Care | Payer: Self-pay | Source: Ambulatory Visit | Attending: Surgery

## 2015-12-18 ENCOUNTER — Encounter (HOSPITAL_COMMUNITY): Payer: Self-pay | Admitting: *Deleted

## 2015-12-18 ENCOUNTER — Ambulatory Visit (HOSPITAL_COMMUNITY)
Admission: RE | Admit: 2015-12-18 | Discharge: 2015-12-18 | Disposition: A | Discharge status: Home / Self Care | Payer: Self-pay | Source: Ambulatory Visit | Attending: Surgery | Admitting: Surgery

## 2015-12-18 DIAGNOSIS — F419 Anxiety disorder, unspecified: Secondary | ICD-10-CM | POA: Insufficient documentation

## 2015-12-18 DIAGNOSIS — F17200 Nicotine dependence, unspecified, uncomplicated: Secondary | ICD-10-CM | POA: Insufficient documentation

## 2015-12-18 DIAGNOSIS — K4090 Unilateral inguinal hernia, without obstruction or gangrene, not specified as recurrent: Secondary | ICD-10-CM | POA: Insufficient documentation

## 2015-12-18 DIAGNOSIS — F329 Major depressive disorder, single episode, unspecified: Secondary | ICD-10-CM | POA: Insufficient documentation

## 2015-12-18 DIAGNOSIS — J449 Chronic obstructive pulmonary disease, unspecified: Secondary | ICD-10-CM | POA: Insufficient documentation

## 2015-12-18 HISTORY — PX: INGUINAL HERNIA REPAIR: SHX194

## 2015-12-18 SURGERY — REPAIR, HERNIA, INGUINAL, ADULT
Anesthesia: General | Laterality: Right

## 2015-12-18 MED ORDER — DEXAMETHASONE SODIUM PHOSPHATE 10 MG/ML IJ SOLN
INTRAMUSCULAR | Status: DC | PRN
  Administered 2015-12-18: 10 mg via INTRAVENOUS

## 2015-12-18 MED ORDER — FENTANYL CITRATE (PF) 100 MCG/2ML IJ SOLN
INTRAMUSCULAR | Status: AC
  Filled 2015-12-18: qty 2

## 2015-12-18 MED ORDER — FENTANYL CITRATE (PF) 100 MCG/2ML IJ SOLN
100.0000 ug | Freq: Once | INTRAMUSCULAR | Status: AC
  Administered 2015-12-18 (×4): 50 ug via INTRAVENOUS
  Administered 2015-12-18: 100 ug via INTRAVENOUS

## 2015-12-18 MED ORDER — HYDROMORPHONE HCL 1 MG/ML IJ SOLN
INTRAMUSCULAR | Status: AC
  Filled 2015-12-18: qty 1

## 2015-12-18 MED ORDER — LIP MEDEX EX OINT
TOPICAL_OINTMENT | CUTANEOUS | Status: AC
  Filled 2015-12-18: qty 7

## 2015-12-18 MED ORDER — BUPIVACAINE LIPOSOME 1.3 % IJ SUSP
20.0000 mL | Freq: Once | INTRAMUSCULAR | Status: AC
  Administered 2015-12-18: 10 mL
  Filled 2015-12-18: qty 20

## 2015-12-18 MED ORDER — CHLORHEXIDINE GLUCONATE CLOTH 2 % EX PADS: 6.0000 | MEDICATED_PAD | Freq: Once | CUTANEOUS | Status: DC

## 2015-12-18 MED ORDER — LACTATED RINGERS IV SOLN: INTRAVENOUS | Status: DC

## 2015-12-18 MED ORDER — ROPIVACAINE HCL 7.5 MG/ML IJ SOLN
INTRAMUSCULAR | Status: DC | PRN
  Administered 2015-12-18: 20 mL

## 2015-12-18 MED ORDER — 0.9 % SODIUM CHLORIDE (POUR BTL) OPTIME
TOPICAL | Status: DC | PRN
Start: 2015-12-18 — End: 2015-12-18
  Administered 2015-12-18: 1000 mL

## 2015-12-18 MED ORDER — CEFAZOLIN SODIUM-DEXTROSE 2-4 GM/100ML-% IV SOLN
2.0000 g | INTRAVENOUS | Status: AC
  Administered 2015-12-18: 2 g via INTRAVENOUS

## 2015-12-18 MED ORDER — PROPOFOL 10 MG/ML IV BOLUS
INTRAVENOUS | Status: DC | PRN
  Administered 2015-12-18: 200 mg via INTRAVENOUS

## 2015-12-18 MED ORDER — MIDAZOLAM HCL 5 MG/5ML IJ SOLN
2.0000 mg | Freq: Once | INTRAMUSCULAR | Status: AC
  Administered 2015-12-18 (×2): 2 mg via INTRAVENOUS

## 2015-12-18 MED ORDER — ROPIVACAINE HCL 7.5 MG/ML IJ SOLN
INTRAMUSCULAR | Status: AC
  Filled 2015-12-18: qty 20

## 2015-12-18 MED ORDER — DOCUSATE SODIUM 100 MG PO CAPS: 100.0000 mg | ORAL_CAPSULE | Freq: Two times a day (BID) | ORAL | 0 refills | Status: AC

## 2015-12-18 MED ORDER — PROPOFOL 10 MG/ML IV BOLUS
INTRAVENOUS | Status: AC
  Filled 2015-12-18: qty 20

## 2015-12-18 MED ORDER — LACTATED RINGERS IV SOLN
INTRAVENOUS | Status: DC | PRN
  Administered 2015-12-18: 09:00:00 via INTRAVENOUS

## 2015-12-18 MED ORDER — MIDAZOLAM HCL 2 MG/2ML IJ SOLN: 0.5000 mg | Freq: Once | INTRAMUSCULAR | Status: DC | PRN

## 2015-12-18 MED ORDER — MEPERIDINE HCL 50 MG/ML IJ SOLN: 6.2500 mg | INTRAMUSCULAR | Status: DC | PRN

## 2015-12-18 MED ORDER — CEFAZOLIN SODIUM-DEXTROSE 2-4 GM/100ML-% IV SOLN
INTRAVENOUS | Status: AC
  Filled 2015-12-18: qty 100

## 2015-12-18 MED ORDER — OXYCODONE-ACETAMINOPHEN 5-325 MG PO TABS
1.0000 | ORAL_TABLET | ORAL | Status: DC | PRN
  Administered 2015-12-18: 1 via ORAL
  Filled 2015-12-18: qty 1

## 2015-12-18 MED ORDER — LACTATED RINGERS IV SOLN
INTRAVENOUS | Status: DC
  Administered 2015-12-18: 09:00:00 via INTRAVENOUS

## 2015-12-18 MED ORDER — ONDANSETRON HCL 4 MG/2ML IJ SOLN
INTRAMUSCULAR | Status: AC
  Filled 2015-12-18: qty 2

## 2015-12-18 MED ORDER — PROMETHAZINE HCL 25 MG/ML IJ SOLN: 6.2500 mg | INTRAMUSCULAR | Status: DC | PRN

## 2015-12-18 MED ORDER — MIDAZOLAM HCL 2 MG/2ML IJ SOLN
INTRAMUSCULAR | Status: AC
  Filled 2015-12-18: qty 2

## 2015-12-18 MED ORDER — HYDROMORPHONE HCL 1 MG/ML IJ SOLN
0.2500 mg | INTRAMUSCULAR | Status: DC | PRN
  Administered 2015-12-18 (×4): 0.5 mg via INTRAVENOUS

## 2015-12-18 MED ORDER — HEPARIN SODIUM (PORCINE) 5000 UNIT/ML IJ SOLN
5000.0000 [IU] | Freq: Once | INTRAMUSCULAR | Status: AC
  Administered 2015-12-18: 5000 [IU] via SUBCUTANEOUS
  Filled 2015-12-18: qty 1

## 2015-12-18 MED ORDER — ONDANSETRON HCL 4 MG/2ML IJ SOLN
INTRAMUSCULAR | Status: DC | PRN
  Administered 2015-12-18: 4 mg via INTRAVENOUS

## 2015-12-18 MED ORDER — LIDOCAINE 2% (20 MG/ML) 5 ML SYRINGE
INTRAMUSCULAR | Status: DC | PRN
  Administered 2015-12-18: 20 mg via INTRAVENOUS

## 2015-12-18 MED ORDER — DEXAMETHASONE SODIUM PHOSPHATE 10 MG/ML IJ SOLN
INTRAMUSCULAR | Status: AC
  Filled 2015-12-18: qty 1

## 2015-12-18 MED ORDER — OXYCODONE-ACETAMINOPHEN 5-325 MG PO TABS: 1.0000 | ORAL_TABLET | Freq: Four times a day (QID) | ORAL | 0 refills | Status: AC | PRN

## 2015-12-18 SURGICAL SUPPLY — 31 items
BENZOIN TINCTURE PRP APPL 2/3 (GAUZE/BANDAGES/DRESSINGS) ×3 IMPLANT
BLADE SURG 15 STRL LF DISP TIS (BLADE) ×1 IMPLANT
BLADE SURG 15 STRL SS (BLADE) ×2
CHLORAPREP W/TINT 26ML (MISCELLANEOUS) ×3 IMPLANT
CLOSURE WOUND 1/2 X4 (GAUZE/BANDAGES/DRESSINGS) ×1
COVER SURGICAL LIGHT HANDLE (MISCELLANEOUS) ×3 IMPLANT
DECANTER SPIKE VIAL GLASS SM (MISCELLANEOUS) IMPLANT
DRAIN PENROSE 18X1/2 LTX STRL (DRAIN) ×3 IMPLANT
DRAPE LAPAROTOMY TRNSV 102X78 (DRAPE) ×3 IMPLANT
ELECT PENCIL ROCKER SW 15FT (MISCELLANEOUS) ×3 IMPLANT
ELECT REM PT RETURN 9FT ADLT (ELECTROSURGICAL) ×3
ELECTRODE REM PT RTRN 9FT ADLT (ELECTROSURGICAL) ×1 IMPLANT
GAUZE SPONGE 4X4 12PLY STRL (GAUZE/BANDAGES/DRESSINGS) ×3 IMPLANT
GLOVE BIO SURGEON STRL SZ 6 (GLOVE) ×3 IMPLANT
GLOVE INDICATOR 6.5 STRL GRN (GLOVE) ×3 IMPLANT
GOWN STRL REUS W/TWL LRG LVL3 (GOWN DISPOSABLE) ×3 IMPLANT
GOWN STRL REUS W/TWL XL LVL3 (GOWN DISPOSABLE) ×3 IMPLANT
KIT BASIN OR (CUSTOM PROCEDURE TRAY) ×3 IMPLANT
MESH ULTRAPRO 3X6 7.6X15CM (Mesh General) ×3 IMPLANT
NEEDLE HYPO 22GX1.5 SAFETY (NEEDLE) ×3 IMPLANT
PACK BASIC VI WITH GOWN DISP (CUSTOM PROCEDURE TRAY) ×3 IMPLANT
SPONGE LAP 4X18 X RAY DECT (DISPOSABLE) ×3 IMPLANT
STRIP CLOSURE SKIN 1/2X4 (GAUZE/BANDAGES/DRESSINGS) ×2 IMPLANT
SUT MNCRL AB 4-0 PS2 18 (SUTURE) ×3 IMPLANT
SUT PROLENE 2 0 CT2 30 (SUTURE) ×6 IMPLANT
SUT VIC AB 3-0 SH 27 (SUTURE) ×4
SUT VIC AB 3-0 SH 27XBRD (SUTURE) ×2 IMPLANT
SYR CONTROL 10ML LL (SYRINGE) ×3 IMPLANT
TOWEL OR 17X26 10 PK STRL BLUE (TOWEL DISPOSABLE) ×3 IMPLANT
TOWEL OR NON WOVEN STRL DISP B (DISPOSABLE) ×3 IMPLANT
YANKAUER SUCT BULB TIP 10FT TU (MISCELLANEOUS) IMPLANT

## 2015-12-18 NOTE — Anesthesia Procedure Notes (Signed)
Anesthesia Regional Block:  TAP block  Pre-Anesthetic Checklist: ,, timeout performed, Correct Patient, Correct Site, Correct Laterality, Correct Procedure, Correct Position, site marked, Risks and benefits discussed,  Surgical consent,  Pre-op evaluation,  At surgeon's request and post-op pain management  Laterality: Right  Prep: chloraprep       Needles:  Injection technique: Single-shot  Needle Type: Echogenic Needle     Needle Length: 9cm 9 cm Needle Gauge: 22 and 22 G    Additional Needles:  Procedures: ultrasound guided (picture in chart) TAP block Narrative:  Start time: 12/18/2015 9:21 AM End time: 12/18/2015 9:29 AM Injection made incrementally with aspirations every 5 mL.  Performed by: Personally  Anesthesiologist: Jean RosenthalJACKSON, Taahir Grisby  Additional Notes: Pt identified in Holding room.  Monitors applied. Working IV access confirmed. Sterile prep, drape R flank  #22ga ECHOgenic needle into TAP with US guidance.  20cc 0.75% Ropivacaine injected incrementally after negative test dose.  Good spread of local. Patient asymptomatic, VSS, no heme aspirated, tolerated well.  Sandford Craze Christianne Zacher, MD

## 2015-12-18 NOTE — Anesthesia Procedure Notes (Signed)
Procedure Name: LMA Insertion Date/Time: 12/18/2015 9:44 AM Performed by: Early OsmondEARGLE, Darwin Rothlisberger E Pre-anesthesia Checklist: Patient identified, Emergency Drugs available, Suction available and Patient being monitored Patient Re-evaluated:Patient Re-evaluated prior to inductionOxygen Delivery Method: Circle system utilized Preoxygenation: Pre-oxygenation with 100% oxygen Intubation Type: IV induction Ventilation: Mask ventilation without difficulty LMA: LMA inserted LMA Size: 4.0 Number of attempts: 1 Airway Equipment and Method: Patient positioned with wedge pillow Placement Confirmation: positive ETCO2 and breath sounds checked- equal and bilateral Dental Injury: Teeth and Oropharynx as per pre-operative assessment

## 2015-12-18 NOTE — Discharge Instructions (Signed)
General Anesthesia, Adult, Care After These instructions provide you with information about caring for yourself after your procedure. Your health care provider may also give you more specific instructions. Your treatment has been planned according to current medical practices, but problems sometimes occur. Call your health care provider if you have any problems or questions after your procedure. What can I expect after the procedure? After the procedure, it is common to have:  Vomiting.  A sore throat.  Mental slowness. It is common to feel:  Nauseous.  Cold or shivery.  Sleepy.  Tired.  Sore or achy, even in parts of your body where you did not have surgery. Follow these instructions at home: For at least 24 hours after the procedure:  Do not:  Participate in activities where you could fall or become injured.  Drive.  Use heavy machinery.  Drink alcohol.  Take sleeping pills or medicines that cause drowsiness.  Make important decisions or sign legal documents.  Take care of children on your own.  Rest. Eating and drinking  If you vomit, drink water, juice, or soup when you can drink without vomiting.  Drink enough fluid to keep your urine clear or pale yellow.  Make sure you have little or no nausea before eating solid foods.  Follow the diet recommended by your health care provider. General instructions  Have a responsible adult stay with you until you are awake and alert.  Return to your normal activities as told by your health care provider. Ask your health care provider what activities are safe for you.  Take over-the-counter and prescription medicines only as told by your health care provider.  If you smoke, do not smoke without supervision.  Keep all follow-up visits as told by your health care provider. This is important. Contact a health care provider if:  You continue to have nausea or vomiting at home, and medicines are not helpful.  You  cannot drink fluids or start eating again.  You cannot urinate after 8-12 hours.  You develop a skin rash.  You have fever.  You have increasing redness at the site of your procedure. Get help right away if:  You have difficulty breathing.  You have chest pain.  You have unexpected bleeding.  You feel that you are having a life-threatening or urgent problem. This information is not intended to replace advice given to you by your health care provider. Make sure you discuss any questions you have with your health care provider. Document Released: 04/27/2000 Document Revised: 06/24/2015 Document Reviewed: 01/03/2015 Elsevier Interactive Patient Education  2017 [employer]     CCS _______[facility]  INGUINAL HERNIA REPAIR: POST OP INSTRUCTIONS  Always review your discharge instruction sheet given to you by the facility where your surgery was performed. IF YOU HAVE DISABILITY OR FAMILY LEAVE FORMS, YOU MUST BRING THEM TO THE OFFICE FOR PROCESSING.   DO NOT GIVE THEM TO YOUR DOCTOR.  1. A  prescription for pain medication may be given to you upon discharge.  Take your pain medication as prescribed, if needed.  If narcotic pain medicine is not needed, then you may take acetaminophen (Tylenol) or ibuprofen (Advil) as needed. 2. Take your usually prescribed medications unless otherwise directed. If you need a refill on your pain medication, please contact your pharmacy.  They will contact our office to request authorization. Prescriptions will not be filled after 5 pm or on week-ends. 3. You should follow a light diet the first 24 hours  after arrival home, such as soup and crackers, etc.  Be sure to include lots of fluids daily.  Resume your normal diet the day after surgery. 4.Most patients will experience some swelling and bruising in the groin and scrotum.  Ice packs and reclining will help.  Swelling and bruising can take several days to resolve.  6. It is common  to experience some constipation if taking pain medication after surgery.  Increasing fluid intake and taking a stool softener (such as Colace) will usually help or prevent this problem from occurring.  A mild laxative (Milk of Magnesia or Miralax) should be taken according to package directions if there are no bowel movements after 48 hours. 7. Unless discharge instructions indicate otherwise, you may remove your bandages 24-48 hours after surgery, and you may shower at that time.  You may have steri-strips (small skin tapes) in place directly over the incision.  These strips should be left on the skin for 7-10 days.  If your surgeon used skin glue on the incision, you may shower in 24 hours.  The glue will flake off over the next 2-3 weeks.  Any sutures or staples will be removed at the office during your follow-up visit. 8. ACTIVITIES:  You may resume regular (light) daily activities beginning the next day--such as daily self-care, walking, climbing stairs--gradually increasing activities as tolerated.  You may have sexual intercourse when it is comfortable.  Refrain from any heavy lifting or straining until approved by your doctor.  a.You may drive when you are no longer taking prescription pain medication, you can comfortably wear a seatbelt, and you can safely maneuver your car and apply brakes. b.RETURN TO WORK: in 1 week for light duty only. No strenuous work or lifting >15lb for 6 weeks total after surgery.  _____________________________________________  9.You should see your doctor in the office for a follow-up appointment approximately 2-3 weeks after your surgery.  Make sure that you call for this appointment within a day or two after you arrive home to insure a convenient appointment time. 10.OTHER INSTRUCTIONS: _________________________    _____________________________________  WHEN TO CALL YOUR DOCTOR: 1. Fever over 101.0 2. Inability to urinate 3. Nausea and/or vomiting 4. Extreme  swelling or bruising 5. Continued bleeding from incision. 6. Increased pain, redness, or drainage from the incision  The clinic staff is available to answer your questions during regular business hours.  Please dont hesitate to call and ask to speak to one of the nurses for clinical concerns.  If you have a medical emergency, go to the nearest emergency room or call 911.  A surgeon from [facility] is always on call at the hospital   [address], [address], [place], [place]  [address] ?  [address] [address], [place], [place]   [address] [phone] ? [phone] ? FAX [phone] Web site: [email or web address]

## 2015-12-18 NOTE — Anesthesia Preprocedure Evaluation (Addendum)
Anesthesia Evaluation  Patient identified by MRN, date of birth, ID band Patient awake    Reviewed: Allergy & Precautions, NPO status , Patient's Chart, lab work & pertinent test results  History of Anesthesia Complications Negative for: history of anesthetic complications  Airway Mallampati: II  TM Distance: >3 FB Neck ROM: Full    Dental  (+) Poor Dentition, Chipped, Missing, Loose, Dental Advisory Given   Pulmonary COPD,  COPD inhaler, Current Smoker,    breath sounds clear to auscultation       Cardiovascular negative cardio ROS   Rhythm:Regular Rate:Normal     Neuro/Psych negative neurological ROS     GI/Hepatic negative GI ROS, (+)     substance abuse  marijuana use,   Endo/Other  negative endocrine ROS  Renal/GU negative Renal ROS     Musculoskeletal   Abdominal   Peds  Hematology negative hematology ROS (+)   Anesthesia Other Findings   Reproductive/Obstetrics                            Anesthesia Physical Anesthesia Plan  ASA: II  Anesthesia Plan: General   Post-op Pain Management: GA combined w/ Regional for post-op pain   Induction: Intravenous  Airway Management Planned: LMA  Additional Equipment:   Intra-op Plan:   Post-operative Plan:   Informed Consent: I have reviewed the patients History and Physical, chart, labs and discussed the procedure including the risks, benefits and alternatives for the proposed anesthesia with the patient or authorized representative who has indicated his/her understanding and acceptance.   Dental advisory given  Plan Discussed with: CRNA and Surgeon  Anesthesia Plan Comments: (Plan routine monitors, GA- LMA OK, TAP block for post op analgesia)        Anesthesia Quick Evaluation

## 2015-12-18 NOTE — Anesthesia Postprocedure Evaluation (Signed)
Anesthesia Post Note  Patient: Louanne BeltonRichard J Tat  Procedure(s) Performed: Procedure(s) (LRB): OPEN RIGHT INGUINAL HERNIA REPAIR (Right)  Patient location during evaluation: PACU Anesthesia Type: General and Regional Level of consciousness: awake and alert, oriented and patient cooperative Pain management: pain level controlled Vital Signs Assessment: post-procedure vital signs reviewed and stable Respiratory status: spontaneous breathing, nonlabored ventilation and respiratory function stable Cardiovascular status: blood pressure returned to baseline and stable Postop Assessment: no signs of nausea or vomiting Anesthetic complications: no    Last Vitals:  Vitals:   12/18/15 1223 12/18/15 1304  BP: (!) 141/85 136/80  Pulse: 81 76  Resp: 14 16  Temp: 36.8 C     Last Pain:  Vitals:   12/18/15 1304  TempSrc:   PainSc: 5                  Billye Nydam,E. Khadim Lundberg

## 2015-12-18 NOTE — Transfer of Care (Signed)
Immediate Anesthesia Transfer of Care Note  Patient: Louanne Beltonichard J Mcgonagle  Procedure(s) Performed: Procedure(s): OPEN RIGHT INGUINAL HERNIA REPAIR (Right)  Patient Location: PACU  Anesthesia Type:General  Level of Consciousness: awake, alert , oriented and patient cooperative  Airway & Oxygen Therapy: Patient Spontanous Breathing and Patient connected to face mask oxygen  Post-op Assessment: Report given to RN, Post -op Vital signs reviewed and stable and Patient moving all extremities  Post vital signs: Reviewed and stable  Last Vitals:  Vitals:   12/18/15 0932 12/18/15 0933  BP:    Pulse: 83 83  Resp: 11 17  Temp:      Last Pain:  Vitals:   12/18/15 0828  TempSrc: Oral  PainSc: 6       Patients Stated Pain Goal: 6 (12/18/15 [id number])  Complications: No apparent anesthesia complications

## 2015-12-18 NOTE — Progress Notes (Signed)
Assisted Dr. Jackson with right, ultrasound guided, transabdominal plane block. Side rails up, monitors on throughout procedure. See vital signs in flow sheet. Tolerated Procedure well. 

## 2015-12-18 NOTE — Op Note (Signed)
Operative Note  Samuel BeltonRichard J Ohlendorf  [id number]  [id number]  12/18/2015   Surgeon: Berna Buehelsea A Pihu Basil  Assistant: none  Procedure performed: Open inguinal hernia repair with UltraPro mesh: right side  Preop diagnosis:  right inguinal hernia  Post-op diagnosis/intraop findings: right indirect inguinal hernia, cord lipoma  Specimens: none  EBL: 10cc  Complications: none  Description of procedure: After obtaining informed consent and placement of a TAPS block in holding, the patient was taken to the operating room and placed supine on operating room table wheregeneral anesthesia was initiated, preoperative antibiotics were administered, SCDs applied, and a formal timeout was performed. The right groin was clipped and prepped and draped in the usual sterile fashion. An oblique incision was made in the right groin. Soft tissues were dissected using electrocautery until the external oblique aponeurosis was encountered. This was divided sharply to expand the external ring. A plane was bluntly developed between the spermatic cord and the external oblique. The ilioinguinal nerve was suture ligated and divided. The spermatic cord was then bluntly dissected away from the pubic tubercle and encircled with a Penrose. Inspection of the inguinal anatomy revealed a cord lipoma and an indirect hernia. The indirect hernia sac was bluntly dissected away from the cord structures and a robust cremasteric. Once we had affirmatively identified the sac, it was carefully opened in a region that was very thin. Inspection confirmed communication with the peritoneal cavity with no bowel contained currently. The sac was then twisted and clamped at the internal ring, where it was suture ligated with a 3-0 vicryl and reduced into the abdomen.  A 3 x 6 piece of ultra Pro mesh was brought onto the field and trimmed to approximate the field. This was tacked to the pubic tubercle fascia using 2-0 Prolene. Arunning 2-0  Prolene was then used to tack the mesh to the inferior shelving edge. Superiorly the mesh was tacked to the internal oblique using a running 2-0 Prolene. The tails of the mesh were wrapped around the spermatic cord, ensuring adequate room for the cord,and tacked to each other with 2-0 Prolene, and then directed laterally to lie flat. Hemostasis was ensured within the wound. The Penrose was removed. The external oblique aponeurosis was reapproximated with a running 3-0 Vicryl to re-create an external ring. Exparel was injected into the soft tissues and pubic tubercle fascia as well as in the dermis. The Scarpa's was reapproximated with interrupted 3-0 Vicryls. The skin was closed with a few deep dermal 3-0 vicryls and a running subcuticular Monocryl. The field was then cleaned, benzoin and Steri-Strips and sterile bandage were applied. Both testicles were palpated in the scrotum at the end of the case. The patient was then awakened extubated and taken to PACU in stable condition.   All counts were correct at the completion of the case.

## 2015-12-18 NOTE — H&P (Signed)
There have been no changes to the exam below. [name] presents today for open right inguinal hernia repair. Plan for discharge home post-op.   Samuel Vargas 10/30/2015 2:35 PM Location: [facility] Patient #: [id number] DOB: 1980/06/30 Single / Language: [place] / Race: White Male  History of Present Illness (Trachelle Low A. Fredricka Bonineonnor MD; 10/30/2015 2:47 PM) Patient words: This is a very pleasant 35yo man with complaints of a right-sided inguinal hernia. He first noticed it in June or July and feels it has increased in size slightly, it causes him significant discomfort but he denies any nausea, vomiting, change in bowel function or change in urinary function. He does note that when he pushes on it the skin feels slightly numb. It is usually reducible. He desires repair as he works as a Designer, fashion/clothingroofer and the pain from the hernia is a significant hindrance to his performance at work. He is otherwise healthy except for smoking cigarettes, which he understands the need to quit.  The patient is a 35 year old male.   Other Problems [name]([name], CMA; 10/30/2015 2:35 PM) Anxiety Disorder Back Pain Depression Inguinal Hernia Kidney Stone  Past Surgical History [name]([name], CMA; 10/30/2015 2:35 PM) No pertinent past surgical history  Diagnostic Studies History [name]([name], CMA; 10/30/2015 2:35 PM) Colonoscopy never  Allergies [name]([name], CMA; 10/30/2015 2:36 PM) No Known Drug Allergies 10/30/2015  Medication History [name]([name], CMA; 10/30/2015 2:36 PM) No Current Medications Medications Reconciled  Social History [name]([name], CMA; 10/30/2015 2:35 PM) Alcohol use Occasional alcohol use. Caffeine use Carbonated beverages, Coffee, Tea. Illicit drug use Prefer to discuss with provider. Tobacco use Current every day smoker.  Family History [name]([name], CMA; 10/30/2015 2:35 PM) Alcohol Abuse Mother. Diabetes Mellitus Brother. Hypertension  Mother.     Review of Systems [name]([name] CMA; 10/30/2015 2:35 PM) General Present- Night Sweats. Not Present- Appetite Loss, Chills, Fatigue, Fever, Weight Gain and Weight Loss. Skin Not Present- Change in Wart/Mole, Dryness, Hives, Jaundice, New Lesions, Non-Healing Wounds, Rash and Ulcer. HEENT Not Present- Earache, Hearing Loss, Hoarseness, Nose Bleed, Oral Ulcers, Ringing in the Ears, Seasonal Allergies, Sinus Pain, Sore Throat, Visual Disturbances, Wears glasses/contact lenses and Yellow Eyes. Respiratory Present- Snoring and Wheezing. Not Present- Bloody sputum, Chronic Cough and Difficulty Breathing. Breast Not Present- Breast Mass, Breast Pain, Nipple Discharge and Skin Changes. Cardiovascular Not Present- Chest Pain, Difficulty Breathing Lying Down, Leg Cramps, Palpitations, Rapid Heart Rate, Shortness of Breath and Swelling of Extremities. Gastrointestinal Not Present- Abdominal Pain, Bloating, Bloody Stool, Change in Bowel Habits, Chronic diarrhea, Constipation, Difficulty Swallowing, Excessive gas, Gets full quickly at meals, Hemorrhoids, Indigestion, Nausea, Rectal Pain and Vomiting. Male Genitourinary Not Present- Blood in Urine, Change in Urinary Stream, Frequency, Impotence, Nocturia, Painful Urination, Urgency and Urine Leakage. Musculoskeletal Present- Back Pain, Joint Pain and Joint Stiffness. Not Present- Muscle Pain, Muscle Weakness and Swelling of Extremities. Neurological Present- Numbness and Tingling. Not Present- Decreased Memory, Fainting, Headaches, Seizures, Tremor, Trouble walking and Weakness. Psychiatric Present- Anxiety. Not Present- Bipolar, Change in Sleep Pattern, Depression, Fearful and Frequent crying. Endocrine Not Present- Cold Intolerance, Excessive Hunger, Hair Changes, Heat Intolerance, Hot flashes and New Diabetes. Hematology Not Present- Blood Thinners, Easy Bruising, Excessive bleeding, Gland problems, HIV and Persistent Infections.  Vitals  ([name] CMA; 10/30/2015 2:36 PM) 10/30/2015 2:35 PM Weight: 149 lb Height: 65in Body Surface Area: 1.75 m Body Mass Index: 24.79 kg/m  Temp.: 37F(Temporal)  Pulse: 79 (Regular)  BP: 128/84 (Sitting, Left Arm, Standard)  Physical Exam ([name] A. Fredricka Bonineonnor MD; 10/30/2015 2:50 PM)  The physical exam findings are as follows: Note:Alert, oriented, no acute distress Extraocular movements intact, pupils equally round and reactive, anicteric, moist mucus membranes Neck without mass or thyromegaly Unlabored resp, CTAB, RRR palpable distal pulses Abdomen soft, nontender, nondistended Reducible right inguinal hernia. No hernia on the left Ext WWP Neuro grossly normal Psych normal mood and affect    Assessment & Plan (Maryellen Dowdle A. Fredricka Bonineonnor MD; 10/30/2015 2:52 PM)  RIGHT INGUINAL HERNIA (Principal Diagnosis) (K40.90) Story: We discussed the natural history of hernia and the process of open inguinal hernia repair with mesh. Discussed risk of injury to adjacent structures and sequelae, although low risk. Discussed importance of smoking cessation. Will plan for open right inguinal hernia repair with mesh in the coming weeks  Current Plans You are being scheduled for surgery - Our schedulers will call you.  You should hear from our office's scheduling department within 5 working days about the location, date, and time of surgery. We try to make accommodations for patient's preferences in scheduling surgery, but sometimes the OR schedule or the surgeon's schedule prevents us from making those accommodations.  If you have not heard from our office [phone]) in 5 working days, call the office and ask for your surgeon's nurse.  If you have other questions about your diagnosis, plan, or surgery, call the office and ask for your surgeon's nurse.  Pt Education - Pamphlet Given - Hernia Surgery: discussed with patient and provided information.

## 2016-07-05 ENCOUNTER — Emergency Department (HOSPITAL_BASED_OUTPATIENT_CLINIC_OR_DEPARTMENT_OTHER): Payer: Self-pay

## 2016-07-05 ENCOUNTER — Encounter (HOSPITAL_BASED_OUTPATIENT_CLINIC_OR_DEPARTMENT_OTHER): Payer: Self-pay | Admitting: *Deleted

## 2016-07-05 ENCOUNTER — Emergency Department (HOSPITAL_BASED_OUTPATIENT_CLINIC_OR_DEPARTMENT_OTHER)
Admission: EM | Admit: 2016-07-05 | Discharge: 2016-07-05 | Disposition: A | Discharge status: Home / Self Care | Payer: Self-pay | Attending: Emergency Medicine | Admitting: Emergency Medicine

## 2016-07-05 DIAGNOSIS — R1031 Right lower quadrant pain: Secondary | ICD-10-CM | POA: Insufficient documentation

## 2016-07-05 DIAGNOSIS — Z79899 Other long term (current) drug therapy: Secondary | ICD-10-CM | POA: Insufficient documentation

## 2016-07-05 DIAGNOSIS — F17210 Nicotine dependence, cigarettes, uncomplicated: Secondary | ICD-10-CM | POA: Insufficient documentation

## 2016-07-05 LAB — COMPREHENSIVE METABOLIC PANEL
ALBUMIN: 4 g/dL (ref 3.5–5.0)
ALK PHOS: 50 U/L (ref 38–126)
ALT: 16 U/L — AB (ref 17–63)
ANION GAP: 8 (ref 5–15)
AST: 20 U/L (ref 15–41)
BUN: 12 mg/dL (ref 6–20)
CALCIUM: 8.7 mg/dL — AB (ref 8.9–10.3)
CHLORIDE: 107 mmol/L (ref 101–111)
CO2: 25 mmol/L (ref 22–32)
Creatinine, Ser: 0.86 mg/dL (ref 0.61–1.24)
GFR calc non Af Amer: >60 mL/min (ref >60)
GLUCOSE: 73 mg/dL (ref 65–99)
Potassium: 3.7 mmol/L (ref 3.5–5.1)
SODIUM: 140 mmol/L (ref 135–145)
Total Bilirubin: 0.3 mg/dL (ref 0.3–1.2)
Total Protein: 6.7 g/dL (ref 6.5–8.1)

## 2016-07-05 LAB — URINALYSIS, ROUTINE W REFLEX MICROSCOPIC
Bilirubin Urine: NEGATIVE
Glucose, UA: NEGATIVE mg/dL
Hgb urine dipstick: NEGATIVE
Ketones, ur: NEGATIVE mg/dL
NITRITE: NEGATIVE
PH: 7 (ref 5.0–8.0)
Protein, ur: NEGATIVE mg/dL
Specific Gravity, Urine: 1.023 (ref 1.005–1.030)

## 2016-07-05 LAB — URINALYSIS, MICROSCOPIC (REFLEX): RBC / HPF: NONE SEEN RBC/hpf (ref 0–5)

## 2016-07-05 LAB — CBC WITH DIFFERENTIAL/PLATELET
BASOS PCT: 0 %
Basophils Absolute: 0 10*3/uL (ref 0.0–0.1)
EOS ABS: 0.2 10*3/uL (ref 0.0–0.7)
EOS PCT: 2 %
HCT: 45.7 % (ref 39.0–52.0)
Hemoglobin: 15.3 g/dL (ref 13.0–17.0)
Lymphocytes Relative: 29 %
Lymphs Abs: 3.2 10*3/uL (ref 0.7–4.0)
MCH: 30.5 pg (ref 26.0–34.0)
MCHC: 33.5 g/dL (ref 30.0–36.0)
MCV: 91 fL (ref 78.0–100.0)
Monocytes Absolute: 0.9 10*3/uL (ref 0.1–1.0)
Monocytes Relative: 8 %
NEUTROS PCT: 61 %
Neutro Abs: 6.6 10*3/uL (ref 1.7–7.7)
PLATELETS: 283 10*3/uL (ref 150–400)
RBC: 5.02 MIL/uL (ref 4.22–5.81)
RDW: 14.3 % (ref 11.5–15.5)
WBC: 10.9 10*3/uL — ABNORMAL HIGH (ref 4.0–10.5)

## 2016-07-05 MED ORDER — IOPAMIDOL (ISOVUE-300) INJECTION 61%
100.0000 mL | Freq: Once | INTRAVENOUS | Status: AC | PRN
  Administered 2016-07-05: 100 mL via INTRAVENOUS

## 2016-07-05 MED ORDER — OXYCODONE-ACETAMINOPHEN 5-325 MG PO TABS: 1.0000 | ORAL_TABLET | Freq: Four times a day (QID) | ORAL | 0 refills | Status: AC | PRN

## 2016-07-05 MED ORDER — MORPHINE SULFATE (PF) 4 MG/ML IV SOLN
4.0000 mg | Freq: Once | INTRAVENOUS | Status: AC
  Administered 2016-07-05: 4 mg via INTRAVENOUS
  Filled 2016-07-05: qty 1

## 2016-07-05 NOTE — ED Triage Notes (Signed)
Patient states he had right Inguinal hernia surgery in Nov 2017.  States he recovered from the surgery and over the last few weeks he has had increased pain and tenderness to touch.  Patient denies a recent injury, but works as a Designer, fashion/clothingroofer daily.

## 2016-07-05 NOTE — Discharge Instructions (Signed)
Please schedule a follow-up appointment with a primary doctor order surgery team for further pain management of your groin area. We did not find evidence of abscess or big infection. We did not see evidence of recurrent hernia or problem with your surgical site. Please stay hydrated. Please use the pain medicine to help with her symptoms and discuss with your primary team further pain strategies. If any symptoms change or worsen, please return to the nearest emergency department.

## 2016-07-05 NOTE — ED Provider Notes (Signed)
[facility] Provider Note   CSN: [id number] Arrival date & time: 07/05/16  1312 By signing my name below, I, Levon Hedger, attest that this documentation has been prepared under the direction and in the presence of Tegeler, Canary Brim, MD . Electronically Signed: Levon Hedger, Scribe. 07/05/2016. 4:23 PM.   History   Chief Complaint Chief Complaint  Patient presents with  . Groin Pain    RLQ   HPI Samuel Vargas is a 36 y.o. male who presents to the Emergency Department complaining of gradually worsening, waxing and waning right inguinal pain onset about 3 weeks ago. He currently rates his pain as 4/10, but states this increased to 10/10 with palpation. He has taken OTC pain medication with no relief of pain. Pt states he had right inguinal hernia surgery in 11/17 performed by Phylliss Blakes. Per pt, he had recovered from his surgery and was not experiencing any pain until 3 weeks ago. Pt denies any erythema, swelling, warmth, diarrhea, constipation, nausea, vomiting, leg pain, leg swelling, fevers, chills, chest pain, SOB, cough, penile pain, or testicular pain. Pt has no other acute complaints or associated symptoms at this time.      The history is provided by the patient.  No language interpreter was used.   Abdominal Pain    This is a new problem. The current episode started more than 1 week ago. The problem occurs constantly. The problem has been gradually worsening. The pain is located in the suprapubic region. The pain is at a severity of 10/10. The pain is severe. Pertinent negatives include fever, nausea and vomiting. The symptoms are aggravated by palpation.  Nothing relieves the symptoms.   Past Medical History:  Diagnosis Date  . Degenerative joint disease of low back    "degenerative disc disease- no current issues"  . H/O wheezing    uses Albuterol as needed.  Marland Kitchen Heart murmur    infant- "premature infant-3 months"  . History of kidney stones    x 3  episodes  . Kidney stones   . Lower back pain   . Renal disorder     There are no active problems to display for this patient.   Past Surgical History:  Procedure Laterality Date  . INGUINAL HERNIA REPAIR Right 12/18/2015   Procedure: OPEN RIGHT INGUINAL HERNIA REPAIR;  Surgeon: Berna Bue, MD;  Location: [facility];  Service: General;  Laterality: Right;  With MESH      Home Medications    Prior to Admission medications   Medication Sig Start Date End Date Taking? Authorizing Provider  albuterol (PROVENTIL HFA;VENTOLIN HFA) 108 (90 Base) MCG/ACT inhaler Inhale 1-2 puffs into the lungs every 6 (six) hours as needed for shortness of breath.   Yes [provider]  oxyCODONE-acetaminophen (PERCOCET/ROXICET) 5-325 MG tablet Take 1 tablet by mouth every 6 (six) hours as needed for severe pain. 12/18/15   Berna Bue, MD    Family History No family history on file.  Social History Social History  Substance Use Topics  . Smoking status: Current Every Day Smoker    Packs/day: 1.00    Types: Cigarettes  . Smokeless tobacco: Never Used  . Alcohol use Yes     Comment: weekends     Allergies   Bee venom and Tramadol   Review of Systems Review of Systems  Constitutional: Negative for chills and fever.  Respiratory: Negative for shortness of breath.   Cardiovascular: Negative for leg swelling.  Gastrointestinal: Positive  for abdominal pain. Negative for nausea and vomiting.  Genitourinary: Negative for penile swelling, scrotal swelling and testicular pain.  Skin: Negative for color change.  All other systems reviewed and are negative.   Physical Exam Updated Vital Signs BP (!) 142/80 (BP Location: Right Arm)   Pulse (!) 52   Temp 98.5 F (36.9 C) (Oral)   Resp 18   Ht 5\' 5"  (1.651 m)   Wt 150 lb (68 kg)   SpO2 100%   BMI 24.96 kg/m   Physical Exam  Constitutional: He is oriented to person, place, and time. He appears well-developed and  well-nourished.  HENT:  Head: Normocephalic and atraumatic.  Mouth/Throat: Oropharynx is clear and moist. No oropharyngeal exudate.  Eyes: EOM are normal. Pupils are equal, round, and reactive to light.  Neck: Normal range of motion.  Cardiovascular: Normal rate, regular rhythm, normal heart sounds and intact distal pulses.   Pulmonary/Chest: Effort normal and breath sounds normal. No respiratory distress. He has no wheezes. He has no rales. He exhibits no tenderness.  Abdominal: Soft. He exhibits no distension and no mass. There is tenderness. There is no guarding. No hernia.  Well appearing right inguinal hernia scar with no erythema. 3 cm area of tenderness in the inguinal area.  Genitourinary: Penis normal. No penile tenderness.  Musculoskeletal: Normal range of motion. He exhibits tenderness. He exhibits no edema.  Neurological: He is alert and oriented to person, place, and time. No sensory deficit. He exhibits normal muscle tone.  Skin: Skin is warm and dry. Capillary refill takes less than 2 seconds. No erythema. No pallor.  Psychiatric: He has a normal mood and affect. Judgment normal.  Nursing note and vitals reviewed.  ED Treatments / Results  DIAGNOSTIC STUDIES:  Oxygen Saturation is 100% on RA, normal by my interpretation.    COORDINATION OF CARE:  4:23 PM Discussed treatment plan with pt at bedside and pt agreed to plan.   Labs (all labs ordered are listed, but only abnormal results are displayed) Labs Reviewed  URINALYSIS, ROUTINE W REFLEX MICROSCOPIC - Abnormal; Notable for the following:       Result Value   Color, Urine AMBER (*)    Leukocytes, UA TRACE (*)    All other components within normal limits  URINALYSIS, MICROSCOPIC (REFLEX) - Abnormal; Notable for the following:    Bacteria, UA FEW (*)    Squamous Epithelial / LPF 0-5 (*)    All other components within normal limits  CBC WITH DIFFERENTIAL/PLATELET - Abnormal; Notable for the following:    WBC 10.9  (*)    All other components within normal limits  COMPREHENSIVE METABOLIC PANEL - Abnormal; Notable for the following:    Calcium 8.7 (*)    ALT 16 (*)    All other components within normal limits    EKG  EKG Interpretation None       Radiology Ct Abdomen Pelvis W Contrast  Result Date: 07/05/2016 CLINICAL DATA:  Right groin pain EXAM: CT ABDOMEN AND PELVIS WITH CONTRAST TECHNIQUE: Multidetector CT imaging of the abdomen and pelvis was performed using the standard protocol following bolus administration of intravenous contrast. CONTRAST:  ISOVUE-300 COMPARISON:  05/08/2014 FINDINGS: Lower chest: No acute abnormality. Hepatobiliary: No focal liver abnormality is seen. No gallstones, gallbladder wall thickening, or biliary dilatation. Pancreas: Unremarkable. No pancreatic ductal dilatation or surrounding inflammatory changes. Spleen: Normal in size without focal abnormality. Adrenals/Urinary Tract: Adrenal glands are unremarkable. Kidneys are normal, without renal calculi, focal lesion,  or hydronephrosis. Bladder is unremarkable. Stomach/Bowel: Stomach is within normal limits. Appendix appears normal. No evidence of bowel wall thickening, distention, or inflammatory changes. Vascular/Lymphatic: Aortic atherosclerosis. No enlarged abdominal or pelvic lymph nodes. Reproductive: The prostate is within normal limits. Seminal vesicles are mildly prominent. Other: No abdominal wall hernia or abnormality. No abdominopelvic ascites. Musculoskeletal: Degenerative changes of lumbar spine are seen. IMPRESSION: No acute abnormality is noted. Electronically Signed   By: Alcide CleverMark  Lukens M.D.   On: 07/05/2016 18:10    Procedures Procedures (including critical care time)  Medications Ordered in ED Medications  morphine 4 MG/ML injection 4 mg (4 mg Intravenous Given 07/05/16 1639)  iopamidol (ISOVUE-300) 61 % injection 100 mL (100 mLs Intravenous Contrast Given 07/05/16 1744)     Initial Impression /  Assessment and Plan / ED Course  I have reviewed the triage vital signs and the nursing notes.  Pertinent labs & imaging results that were available during my care of the patient were reviewed by me and considered in my medical decision making (see chart for details).     Nicholaos Urbano HeirJ Hammen is a 36 y.o. male With a past medical history significant of right inguinal hernia surgery who presents with right inguinal pain. Patient denies recent traumas. He denies fevers, chills, or other infectious symptoms. He denies dysuria or difficulty with bowel movements. He reports having an area of pain near his surgical site. No report of erythema or skin changes.  History and exam are seen above. On exam, patient has tenderness near the surgical site on the right inguinal canal. No testicle tenderness. Normal GU exam. No hernia palpation. No abdominal tenderness otherwise. No CVA tenderness.  Given patient's pain near the surgical site, patient will have imaging and labs to look for evidence of re-herniation, abscess, or other abnormality.  Patient given pain medicine with significant improvement in symptoms. Laboratory testing showed mild leukocytosis but no other significant abnormalities. No convincing evidence of UTI.  CT scan showed no evidence of acute abnormality. Pacifically, no reherniation or problem with the mesh seen. No abscess.  Patient reassured on findings. Patient instructed to follow up with PCP and his surgery team for further pain management. Patient given strict return precautions. Patient given prescription for some pain medication. Patient will follow up and understood plan of care. Patient discharged in good condition.    Final Clinical Impressions(s) / ED Diagnoses   Final diagnoses:  Right inguinal pain    New Prescriptions Discharge Medication List as of 07/05/2016  6:55 PM    START taking these medications   Details  !! oxyCODONE-acetaminophen (PERCOCET/ROXICET) 5-325 MG  tablet Take 1 tablet by mouth every 6 (six) hours as needed for severe pain., Starting Sun 07/05/2016, Print     !! - Potential duplicate medications found. Please discuss with provider.      I personally performed the services described in this documentation, which was scribed in my presence. The recorded information has been reviewed and is accurate.  Clinical Impression: 1. Right inguinal pain     Disposition: Discharge  Condition: Good  I have discussed the results, Dx and Tx plan with the pt(& family if present). He/she/they expressed understanding and agree(s) with the plan. Discharge instructions discussed at great length. Strict return precautions discussed and pt &/or family have verbalized understanding of the instructions. No further questions at time of discharge.    Discharge Medication List as of 07/05/2016  6:55 PM    START taking these medications  Details  !! oxyCODONE-acetaminophen (PERCOCET/ROXICET) 5-325 MG tablet Take 1 tablet by mouth every 6 (six) hours as needed for severe pain., Starting Sun 07/05/2016, Print     !! - Potential duplicate medications found. Please discuss with provider.      Follow Up: [facility] [address] [place] [address] [phone] Schedule an appointment as soon as possible for a visit    [facility] [place] [facility] [address] [phone] mc [address] [place] [address] [phone]  If symptoms worsen    Tegeler, Canary Brim, MD 07/06/16 [phone]

## 2017-07-02 ENCOUNTER — Other Ambulatory Visit: Payer: Self-pay

## 2017-07-02 ENCOUNTER — Emergency Department (HOSPITAL_BASED_OUTPATIENT_CLINIC_OR_DEPARTMENT_OTHER): Payer: Self-pay

## 2017-07-02 ENCOUNTER — Encounter (HOSPITAL_BASED_OUTPATIENT_CLINIC_OR_DEPARTMENT_OTHER): Payer: Self-pay | Admitting: *Deleted

## 2017-07-02 ENCOUNTER — Emergency Department (HOSPITAL_BASED_OUTPATIENT_CLINIC_OR_DEPARTMENT_OTHER)
Admission: EM | Admit: 2017-07-02 | Discharge: 2017-07-02 | Disposition: A | Discharge status: Home / Self Care | Payer: Self-pay | Attending: Emergency Medicine | Admitting: Emergency Medicine

## 2017-07-02 DIAGNOSIS — F17210 Nicotine dependence, cigarettes, uncomplicated: Secondary | ICD-10-CM | POA: Insufficient documentation

## 2017-07-02 DIAGNOSIS — R109 Unspecified abdominal pain: Secondary | ICD-10-CM

## 2017-07-02 DIAGNOSIS — K529 Noninfective gastroenteritis and colitis, unspecified: Secondary | ICD-10-CM | POA: Insufficient documentation

## 2017-07-02 LAB — COMPREHENSIVE METABOLIC PANEL
ALK PHOS: 46 U/L (ref 38–126)
ALT: 11 U/L — ABNORMAL LOW (ref 17–63)
ANION GAP: 7 (ref 5–15)
AST: 15 U/L (ref 15–41)
Albumin: 3.9 g/dL (ref 3.5–5.0)
BUN: 14 mg/dL (ref 6–20)
CALCIUM: 8.1 mg/dL — AB (ref 8.9–10.3)
CO2: 24 mmol/L (ref 22–32)
CREATININE: 0.8 mg/dL (ref 0.61–1.24)
Chloride: 108 mmol/L (ref 101–111)
GFR calc Af Amer: >60 mL/min (ref >60)
Glucose, Bld: 104 mg/dL — ABNORMAL HIGH (ref 65–99)
Potassium: 3.9 mmol/L (ref 3.5–5.1)
SODIUM: 139 mmol/L (ref 135–145)
TOTAL PROTEIN: 6.6 g/dL (ref 6.5–8.1)
Total Bilirubin: 0.3 mg/dL (ref 0.3–1.2)

## 2017-07-02 LAB — CBC
HCT: 41.3 % (ref 39.0–52.0)
HEMOGLOBIN: 14 g/dL (ref 13.0–17.0)
MCH: 30.2 pg (ref 26.0–34.0)
MCHC: 33.9 g/dL (ref 30.0–36.0)
MCV: 89.2 fL (ref 78.0–100.0)
PLATELETS: 326 10*3/uL (ref 150–400)
RBC: 4.63 MIL/uL (ref 4.22–5.81)
RDW: 13.8 % (ref 11.5–15.5)
WBC: 9.5 10*3/uL (ref 4.0–10.5)

## 2017-07-02 LAB — URINALYSIS, ROUTINE W REFLEX MICROSCOPIC
BILIRUBIN URINE: NEGATIVE
Glucose, UA: NEGATIVE mg/dL
Hgb urine dipstick: NEGATIVE
KETONES UR: NEGATIVE mg/dL
Leukocytes, UA: NEGATIVE
Nitrite: NEGATIVE
PH: 6.5 (ref 5.0–8.0)
Protein, ur: NEGATIVE mg/dL
SPECIFIC GRAVITY, URINE: 1.025 (ref 1.005–1.030)

## 2017-07-02 MED ORDER — HYDROMORPHONE HCL 1 MG/ML IJ SOLN
1.0000 mg | Freq: Once | INTRAMUSCULAR | Status: AC
  Administered 2017-07-02: 1 mg via INTRAVENOUS
  Filled 2017-07-02: qty 1

## 2017-07-02 MED ORDER — ONDANSETRON 4 MG PO TBDP: 4.0000 mg | ORAL_TABLET | Freq: Three times a day (TID) | ORAL | 0 refills | Status: AC | PRN

## 2017-07-02 MED ORDER — ONDANSETRON HCL 4 MG/2ML IJ SOLN
4.0000 mg | Freq: Once | INTRAMUSCULAR | Status: AC
  Administered 2017-07-02: 4 mg via INTRAVENOUS
  Filled 2017-07-02: qty 2

## 2017-07-02 MED ORDER — HYDROCODONE-ACETAMINOPHEN 5-325 MG PO TABS
1.0000 | ORAL_TABLET | Freq: Four times a day (QID) | ORAL | 0 refills | Status: AC | PRN
Start: 2017-07-02 — End: ?

## 2017-07-02 MED ORDER — KETOROLAC TROMETHAMINE 30 MG/ML IJ SOLN
30.0000 mg | Freq: Once | INTRAMUSCULAR | Status: AC
  Administered 2017-07-02: 30 mg via INTRAVENOUS
  Filled 2017-07-02: qty 1

## 2017-07-02 MED ORDER — SODIUM CHLORIDE 0.9 % IV BOLUS
1000.0000 mL | Freq: Once | INTRAVENOUS | Status: AC
  Administered 2017-07-02: 1000 mL via INTRAVENOUS

## 2017-07-02 NOTE — ED Triage Notes (Signed)
Left flank pain. States he has a known kidney stone.

## 2017-07-02 NOTE — Discharge Instructions (Signed)
The CT scan shows some inflammation in your bowel.  This can give you pain in the left back.  You may have some diarrhea in the coming days.  Please take ibuprofen 600 mg every 6 hours for pain.  I have also written you prescription for a short course of Norco, this medicine can make you drowsy so please do not work or drive while taking it.  You can also take Zofran as needed for vomiting.  I have listed the information below to the urologist.  Please call and schedule an appointment for follow-up of your recurrent kidney stones.  Return to the ER if you have any new or concerning symptoms like fever, vomiting that will not stop, worsening abdominal pain.

## 2017-07-02 NOTE — ED Notes (Signed)
Patient transported to CT 

## 2017-07-02 NOTE — ED Provider Notes (Signed)
[facility] EMERGENCY DEPARTMENT Provider Note   CSN: [id number] Arrival date & time: 07/02/17  1223     History   Chief Complaint Chief Complaint  Patient presents with  . Flank Pain    HPI Samuel Vargas is a 37 y.o. male.  HPI  Samuel Vargas is a 37 year old male with a history of recurrent kidney stones and degenerative disc disease who presents to the emergency department for evaluation of left flank pain.  Patient reports that pain began yesterday evening and has gradually worsened.  He reports pain is intermittent, feels "like a lightening bolt" at its worst.  Pain is located over the lower left flank and feels similar to prior kidney stones.  He reports pain is about a 7/10 in severity at this time.  He has been taking Tylenol at home without significant improvement.  Per chart review he was seen in the [facility] emergency department 5/19 for right flank pain in which she had a CT scan performed which showed nonobstructing renal stones.  He denies fevers, chills, interrupted urination, hematuria, urinary frequency, dysuria, testicular pain/swelling, abdominal pain, nausea/vomiting, diarrhea, chest pain, shortness of breath, lightheadedness, syncope.  Reports that he does not have a urologist and has never needed surgical intervention for stones.  Past Medical History:  Diagnosis Date  . Degenerative joint disease of low back    "degenerative disc disease- no current issues"  . H/O wheezing    uses Albuterol as needed.  Marland Kitchen Heart murmur    infant- "premature infant-3 months"  . History of kidney stones    x 3 episodes  . Kidney stones   . Lower back pain   . Renal disorder     There are no active problems to display for this patient.   Past Surgical History:  Procedure Laterality Date  . INGUINAL HERNIA REPAIR Right 12/18/2015   Procedure: OPEN RIGHT INGUINAL HERNIA REPAIR;  Surgeon: Berna Bue, MD;  Location: [facility];  Service: General;  Laterality:  Right;  With MESH        Home Medications    Prior to Admission medications   Medication Sig Start Date End Date Taking? Authorizing Provider  albuterol (PROVENTIL HFA;VENTOLIN HFA) 108 (90 Base) MCG/ACT inhaler Inhale 1-2 puffs into the lungs every 6 (six) hours as needed for shortness of breath.    [provider]  oxyCODONE-acetaminophen (PERCOCET/ROXICET) 5-325 MG tablet Take 1 tablet by mouth every 6 (six) hours as needed for severe pain. 12/18/15   Berna Bue, MD  oxyCODONE-acetaminophen (PERCOCET/ROXICET) 5-325 MG tablet Take 1 tablet by mouth every 6 (six) hours as needed for severe pain. 07/05/16   Tegeler, Canary Brim, MD    Family History No family history on file.  Social History Social History   Tobacco Use  . Smoking status: Current Every Day Smoker    Packs/day: 1.00    Types: Cigarettes  . Smokeless tobacco: Never Used  Substance Use Topics  . Alcohol use: Yes    Comment: weekends  . Drug use: Yes    Types: Marijuana    Comment: states notes in 2 weeks     Allergies   Bee venom and Tramadol   Review of Systems Review of Systems  Constitutional: Negative for chills and fever.  Eyes: Negative for visual disturbance.  Respiratory: Negative for shortness of breath.   Cardiovascular: Negative for chest pain.  Gastrointestinal: Negative for abdominal pain, nausea and vomiting.  Genitourinary: Positive for flank pain. Negative  for difficulty urinating, dysuria, hematuria, scrotal swelling and testicular pain.  Musculoskeletal: Negative for back pain and gait problem.  Skin: Negative for rash.  Neurological: Negative for syncope and weakness.  Psychiatric/Behavioral: Negative for agitation.     Physical Exam Updated Vital Signs BP (!) 152/92   Pulse 81   Temp 98.3 F (36.8 C) (Oral)   Resp 18   Ht  (1.651 m)   Wt 68 kg (150 lb)   SpO2 100%   BMI 24.96 kg/m   Physical Exam  Constitutional: He appears well-developed and  well-nourished. No distress.  Sitting at bedside holding his back, appears painful.  HENT:  Head: Normocephalic and atraumatic.  Mouth/Throat: Oropharynx is clear and moist. No oropharyngeal exudate.  Mucous memories moist.  Eyes: Pupils are equal, round, and reactive to light. Conjunctivae are normal. Right eye exhibits no discharge. Left eye exhibits no discharge.  Neck: Normal range of motion. Neck supple.  Cardiovascular: Normal rate and regular rhythm.  Pulmonary/Chest: Effort normal and breath sounds normal. No stridor. No respiratory distress. He has no wheezes. He has no rales.  Abdominal:  Abdomen soft and nondistended.  Nontender to palpation.  No CVA tenderness.  Musculoskeletal:  No tenderness over the T-spine or L-spine.  Pain over the left flank is not reproducible.  No rashes noted over the back.  Neurological: He is alert. Coordination normal.  Skin: Skin is warm and dry. He is not diaphoretic.  Psychiatric: He has a normal mood and affect. His behavior is normal.  Nursing note and vitals reviewed.    ED Treatments / Results  Labs (all labs ordered are listed, but only abnormal results are displayed) Labs Reviewed  COMPREHENSIVE METABOLIC PANEL - Abnormal; Notable for the following components:      Result Value   Glucose, Bld 104 (*)    Calcium 8.1 (*)    ALT 11 (*)    All other components within normal limits  URINALYSIS, ROUTINE W REFLEX MICROSCOPIC  CBC    EKG None  Radiology Ct Renal Stone Study  Result Date: 07/02/2017 CLINICAL DATA:  Left flank pain EXAM: CT ABDOMEN AND PELVIS WITHOUT CONTRAST TECHNIQUE: Multidetector CT imaging of the abdomen and pelvis was performed following the standard protocol without oral or IV contrast. COMPARISON:  July 05, 2016 FINDINGS: Lower chest: There is no lung base edema or consolidation. There is slight atelectasis in the posterior right lung base. Hepatobiliary: No focal liver lesions are evident on this noncontrast  enhanced study. Gallbladder wall is not appreciably thickened. There is no biliary duct dilatation. Pancreas: No pancreatic mass or inflammatory focus. Spleen: No splenic lesions are evident. Adrenals/Urinary Tract: Adrenals appear unremarkable bilaterally. Kidneys bilaterally show no evident mass or hydronephrosis on either side. There is a 1 mm calculus in the mid right kidney. There is a 1 mm calculus in the upper pole of the left kidney with a 2 mm calculus in the lower pole of the left kidney. There is no ureteral calculus on either side. Urinary bladder is midline. There is urinary bladder wall thickening. Stomach/Bowel: Several loops of jejunum show wall thickening. No other bowel wall thickening is evident. There are scattered colonic diverticula without diverticulitis. No bowel obstruction. No free air or portal venous air. Vascular/Lymphatic: There are foci of calcification in each common iliac artery. No aneurysm evident. Major mesenteric arterial vessels appear patent. Reproductive: Prostate and seminal vesicles appear normal in size and contour. No evident pelvic mass. Other: Appendix appears normal. No  abscess or ascites is evident in the abdomen or pelvis. Musculoskeletal: There are areas of degenerative change lumbar spine. There are no blastic or lytic bone lesions. There is no intramuscular or abdominal wall lesion evident. IMPRESSION: 1. Fold thickening is noted in several loops of jejunum consistent with a degree of enteritis. No evident bowel obstruction. No abscess. Appendix appears normal. 2. 1-2 mm nonobstructing calculi in each kidney. No ureteral calculus or hydronephrosis on either side. 3. Thickening of the urinary bladder wall, a finding felt to represent a degree of cystitis. 4.  Calcification in common iliac arteries bilaterally. Electronically Signed   By: Bretta Bang III M.D.   On: 07/02/2017 13:49    Procedures Procedures (including critical care time)  Medications  Ordered in ED Medications  sodium chloride 0.9 % bolus 1,000 mL (0 mLs Intravenous Stopped 07/02/17 1357)  HYDROmorphone (DILAUDID) injection 1 mg (1 mg Intravenous Given 07/02/17 1329)  ondansetron (ZOFRAN) injection 4 mg (4 mg Intravenous Given 07/02/17 1327)  ketorolac (TORADOL) 30 MG/ML injection 30 mg (30 mg Intravenous Given 07/02/17 1328)     Initial Impression / Assessment and Plan / ED Course  I have reviewed the triage vital signs and the nursing notes.  Pertinent labs & imaging results that were available during my care of the patient were reviewed by me and considered in my medical decision making (see chart for details).     CT stone study reveals enteritis of the jejunum, no bowel obstruction or evidence of abscess. Also shows bilateral non-obstructing renal caliculi and non-specific thickening of the bladder. UA without signs of infection. CBC without leukocytosis. CMP without any major electrolyte abnormalities, creatinine and liver enzymes WNL. Patient does not have abdominal tenderness on exam and denies nausea/vomiting or diarrhea. HE remained afebrile and VSS in the department. His enteritis is likely viral, will treat with symptomatic management including anti-emetic as needed at home and a short course of pain medication. On recheck patient reports improvement in pain. Discussed this patient with Dr. Rush Landmark who agrees that his enteritis is likely referring his pain to the back. Plan to have patient follow up with Urology given he reports recurrent stone disease and has never been evaluated by a Urologist. Discussed reasons to return to the emergency department and also counseled him to have his blood pressure rechecked as it was elevated in the ER today. Patient agrees and voiced understanding to the above plan and appears reliable for follow up.   Final Clinical Impressions(s) / ED Diagnoses   Final diagnoses:  Left flank pain  Enteritis    ED Discharge Orders         Ordered    ondansetron (ZOFRAN ODT) 4 MG disintegrating tablet  Every 8 hours PRN     07/02/17 1551    HYDROcodone-acetaminophen (NORCO/VICODIN) 5-325 MG tablet  Every 6 hours PRN     07/02/17 1551       Kellie Shropshire, PA-C 07/02/17 1949    Tegeler, Canary Brim, MD 07/03/17 [phone]

## 2018-06-15 ENCOUNTER — Emergency Department (HOSPITAL_BASED_OUTPATIENT_CLINIC_OR_DEPARTMENT_OTHER)
Admission: EM | Admit: 2018-06-15 | Discharge: 2018-06-15 | Disposition: A | Discharge status: Home / Self Care | Payer: Self-pay | Attending: Emergency Medicine | Admitting: Emergency Medicine

## 2018-06-15 ENCOUNTER — Emergency Department (HOSPITAL_BASED_OUTPATIENT_CLINIC_OR_DEPARTMENT_OTHER): Payer: Self-pay

## 2018-06-15 ENCOUNTER — Other Ambulatory Visit: Payer: Self-pay

## 2018-06-15 ENCOUNTER — Encounter (HOSPITAL_BASED_OUTPATIENT_CLINIC_OR_DEPARTMENT_OTHER): Payer: Self-pay | Admitting: Emergency Medicine

## 2018-06-15 DIAGNOSIS — Z79899 Other long term (current) drug therapy: Secondary | ICD-10-CM | POA: Insufficient documentation

## 2018-06-15 DIAGNOSIS — M25562 Pain in left knee: Secondary | ICD-10-CM | POA: Insufficient documentation

## 2018-06-15 DIAGNOSIS — F17210 Nicotine dependence, cigarettes, uncomplicated: Secondary | ICD-10-CM | POA: Insufficient documentation

## 2018-06-15 NOTE — Discharge Instructions (Signed)
Please follow up with Dr. Pearletha Forge sports medicine for your knee pain. You may take Ibuprofen and Tylenol as needed for pain. You may also apply ice and elevate leg for comfort. Return to the ED for redness or swelling to the knee or if you develop fevers.

## 2018-06-15 NOTE — ED Triage Notes (Signed)
Pt c/o LT knee pain that started this morning; no injury

## 2018-06-15 NOTE — ED Provider Notes (Signed)
[facility] EMERGENCY DEPARTMENT Provider Note   CSN: [id number] Arrival date & time: 06/15/18  1036    History   Chief Complaint Chief Complaint  Patient presents with  . Knee Pain    HPI Samuel Vargas is a 38 y.o. male who presents to the ED complaining of gradual onset, constant, dull/achy, 10/10, atraumatic, left knee pain that began last night after work. 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 The pain worsened this morning, prompting him to come to the ED. The pain is exacerbated with movement of his knee. He has taking his wife's gabapentin without relief. Denies fever, chills, erythema, swelling, weakness, numbness, or any other associated symptoms. No recent prolonged travel or immobilization. No active malignancy. No hx DVT/PE. No IVDA.       Past Medical History:  Diagnosis Date  . Degenerative joint disease of low back    "degenerative disc disease- no current issues"  . H/O wheezing    uses Albuterol as needed.  Marland Kitchen Heart murmur    infant- "premature infant-3 months"  . History of kidney stones    x 3 episodes  . Kidney stones   . Lower back pain   . Renal disorder     There are no active problems to display for this patient.   Past Surgical History:  Procedure Laterality Date  . INGUINAL HERNIA REPAIR Right 12/18/2015   Procedure: OPEN RIGHT INGUINAL HERNIA REPAIR;  Surgeon: Berna Bue, MD;  Location: [facility];  Service: General;  Laterality: Right;  With MESH        Home Medications    Prior to Admission medications   Medication Sig Start Date End Date Taking? Authorizing Provider  albuterol (PROVENTIL HFA;VENTOLIN HFA) 108 (90 Base) MCG/ACT inhaler Inhale 1-2 puffs into the lungs every 6 (six) hours as needed for shortness of breath.    [provider]  HYDROcodone-acetaminophen (NORCO/VICODIN) 5-325 MG tablet Take 1 tablet by mouth every 6 (six) hours  as needed. 07/02/17   Kellie Shropshire, PA-C  ondansetron (ZOFRAN ODT) 4 MG disintegrating tablet Take 1 tablet (4 mg total) by mouth every 8 (eight) hours as needed for nausea or vomiting. 07/02/17   Kellie Shropshire, PA-C  oxyCODONE-acetaminophen (PERCOCET/ROXICET) 5-325 MG tablet Take 1 tablet by mouth every 6 (six) hours as needed for severe pain. 12/18/15   Berna Bue, MD  oxyCODONE-acetaminophen (PERCOCET/ROXICET) 5-325 MG tablet Take 1 tablet by mouth every 6 (six) hours as needed for severe pain. 07/05/16   Tegeler, Canary Brim, MD    Family History No family history on file.  Social History Social History   Tobacco Use  . Smoking status: Current Every Day Smoker    Packs/day: 1.00    Types: Cigarettes  . Smokeless tobacco: Never Used  Substance Use Topics  . Alcohol use: Yes    Comment: weekends  . Drug use: Yes    Types: Marijuana    Comment: states notes in 2 weeks     Allergies   Bee venom and Tramadol   Review of Systems Review of Systems  Constitutional: Negative for chills and fever.  Musculoskeletal: Positive for arthralgias. Negative for joint swelling.  Skin: Negative for color change and rash.  Neurological: Negative for weakness and numbness.     Physical Exam Updated Vital Signs BP (!) 145/89 (BP Location: Right Arm)   Pulse 70  Temp 98 F (36.7 C) (Oral)   Resp 18   SpO2 100%   Physical Exam Vitals signs and nursing note reviewed.  Constitutional:      Appearance: He is not ill-appearing.  HENT:     Head: Normocephalic and atraumatic.  Eyes:     Conjunctiva/sclera: Conjunctivae normal.  Cardiovascular:     Rate and Rhythm: Normal rate and regular rhythm.  Pulmonary:     Effort: Pulmonary effort is normal.     Breath sounds: Normal breath sounds.  Musculoskeletal:     Comments: No erythema or edema noted to left knee; TTP over lateral aspect of left knee; no tenderness to left calf or popliteal area; ROM limited due to pain;  no crepitus appreciated; no varus or valgus laxity; negative anterior and posterior drawer test; strength 4/5 with knee flexion and extension; sensation intact throughout; 2+ DP and PT pulses.   Skin:    General: Skin is warm and dry.     Coloration: Skin is not jaundiced.  Neurological:     Mental Status: He is alert.      ED Treatments / Results  Labs (all labs ordered are listed, but only abnormal results are displayed) Labs Reviewed - No data to display  EKG None  Radiology Dg Knee Complete 4 Views Left  Result Date: 06/15/2018 CLINICAL DATA:  Pain EXAM: LEFT KNEE - COMPLETE 4+ VIEW COMPARISON:  None. FINDINGS: Frontal, lateral, and bilateral oblique views were obtained. There is no fracture or dislocation. No joint effusion. Joint spaces appear normal. No erosive change. IMPRESSION: No fracture or dislocation. No joint effusion. No evident arthropathy. Electronically Signed   By: Bretta BangWilliam  Woodruff III M.D.   On: 06/15/2018 11:35    Procedures Procedures (including critical care time)  Medications Ordered in ED Medications - No data to display   Initial Impression / Assessment and Plan / ED Course  I have reviewed the triage vital signs and the nursing notes.  Pertinent labs & imaging results that were available during my care of the patient were reviewed by me and considered in my medical decision making (see chart for details).    Pt is a 38 year old male who presents with nontraumatic lateral left knee pain x 1 day. Pt exquisitely tender to lateral aspect with light palpation. No signs of cellulitis on exam including erythema or edema. No hx gout. No excessive alcohol or red meat intake. No IVDA. No tenderness to medial aspect or laxity to knee to suggest ligamentous injury. Xray negative at this time; no signs of joint effusion for possible joint aspiration at this time. Will have patient follow up with sports medicine doctor Dr. Pearletha ForgeHudnall; crutches and knee sleeve given in  the ED for comfort. Ibuprofen/Tylenol PRN for pain. Pt is in agreement with plan and stable for discharge at this time.       Final Clinical Impressions(s) / ED Diagnoses   Final diagnoses:  Acute pain of left knee    ED Discharge Orders    None       Tanda RockersVenter, Donasia Wimes, PA-C 06/15/18 1447    Long, Arlyss RepressJoshua G, MD 06/15/18 1506

## 2020-02-12 IMAGING — DX LEFT KNEE - COMPLETE 4+ VIEW
4 series · 4 of 4 positions shown · non-contrast
Comparison: None.

CLINICAL DATA: Pain

EXAM:
LEFT KNEE - COMPLETE 4+ VIEW

[knee ap]
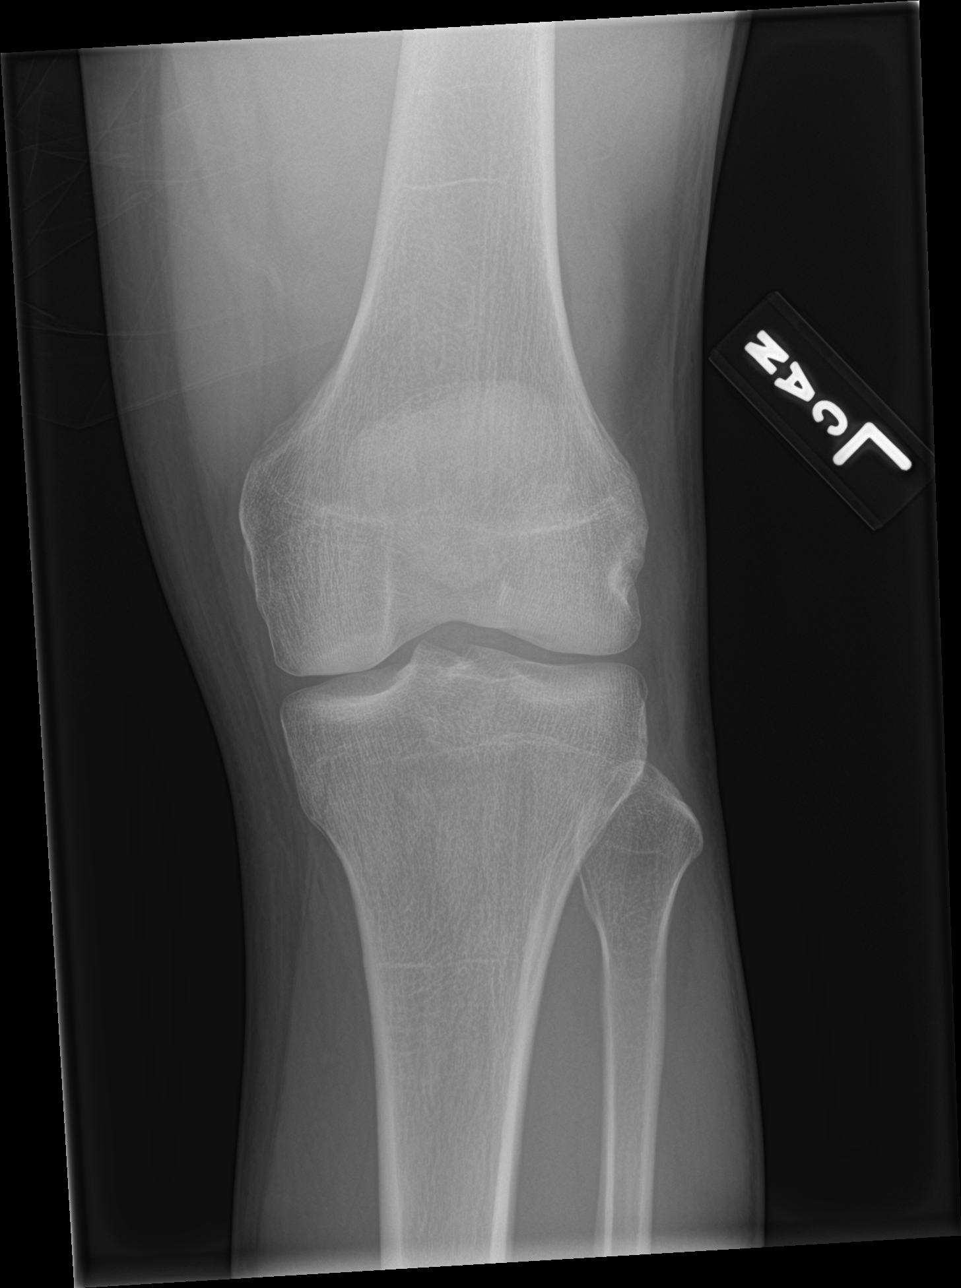

[knee lat]
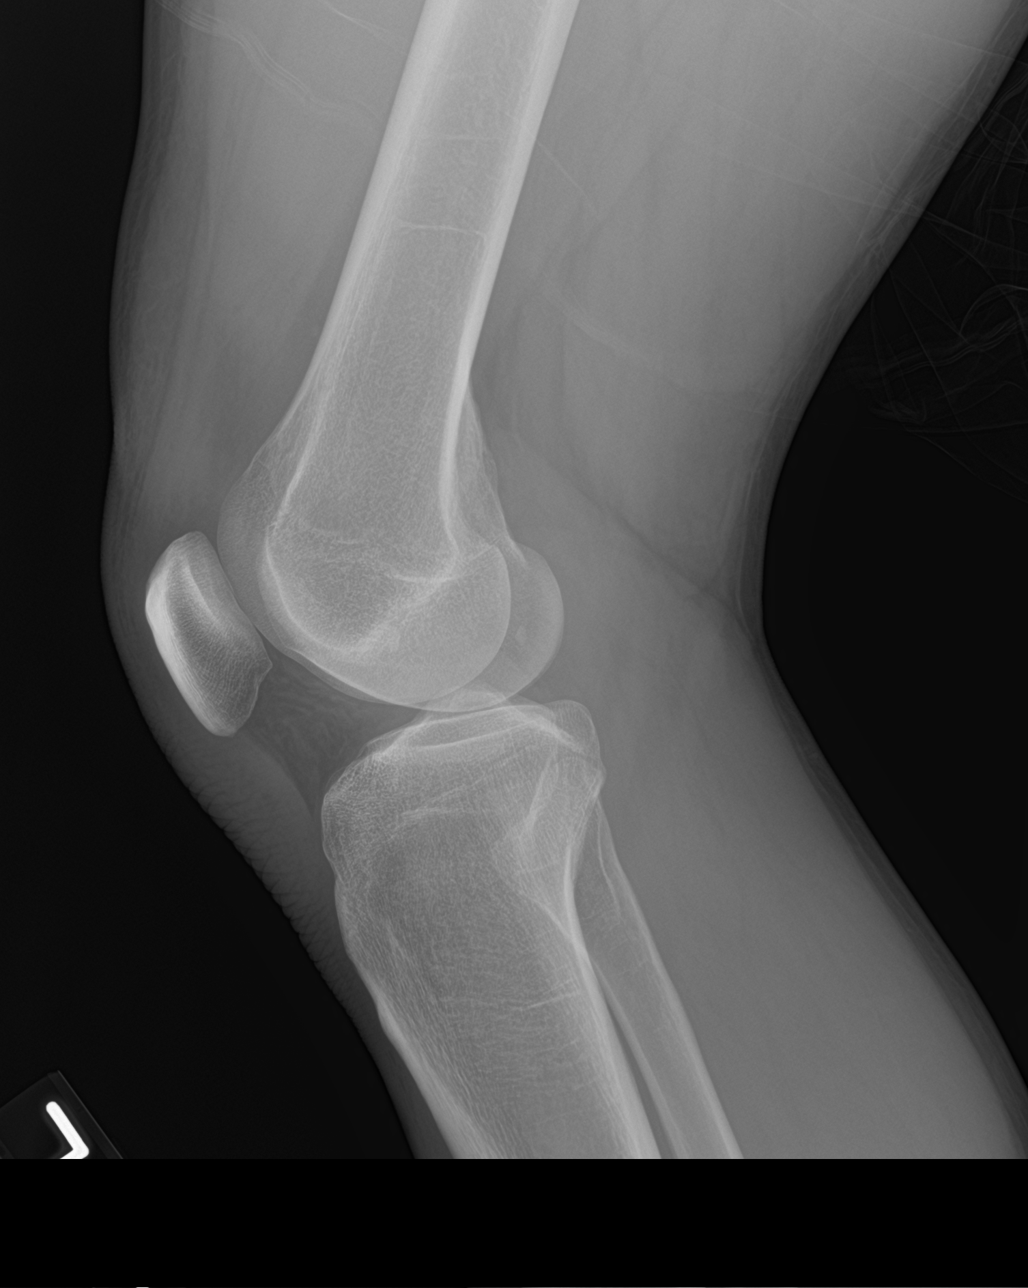

[knee obl (1 of 2)]
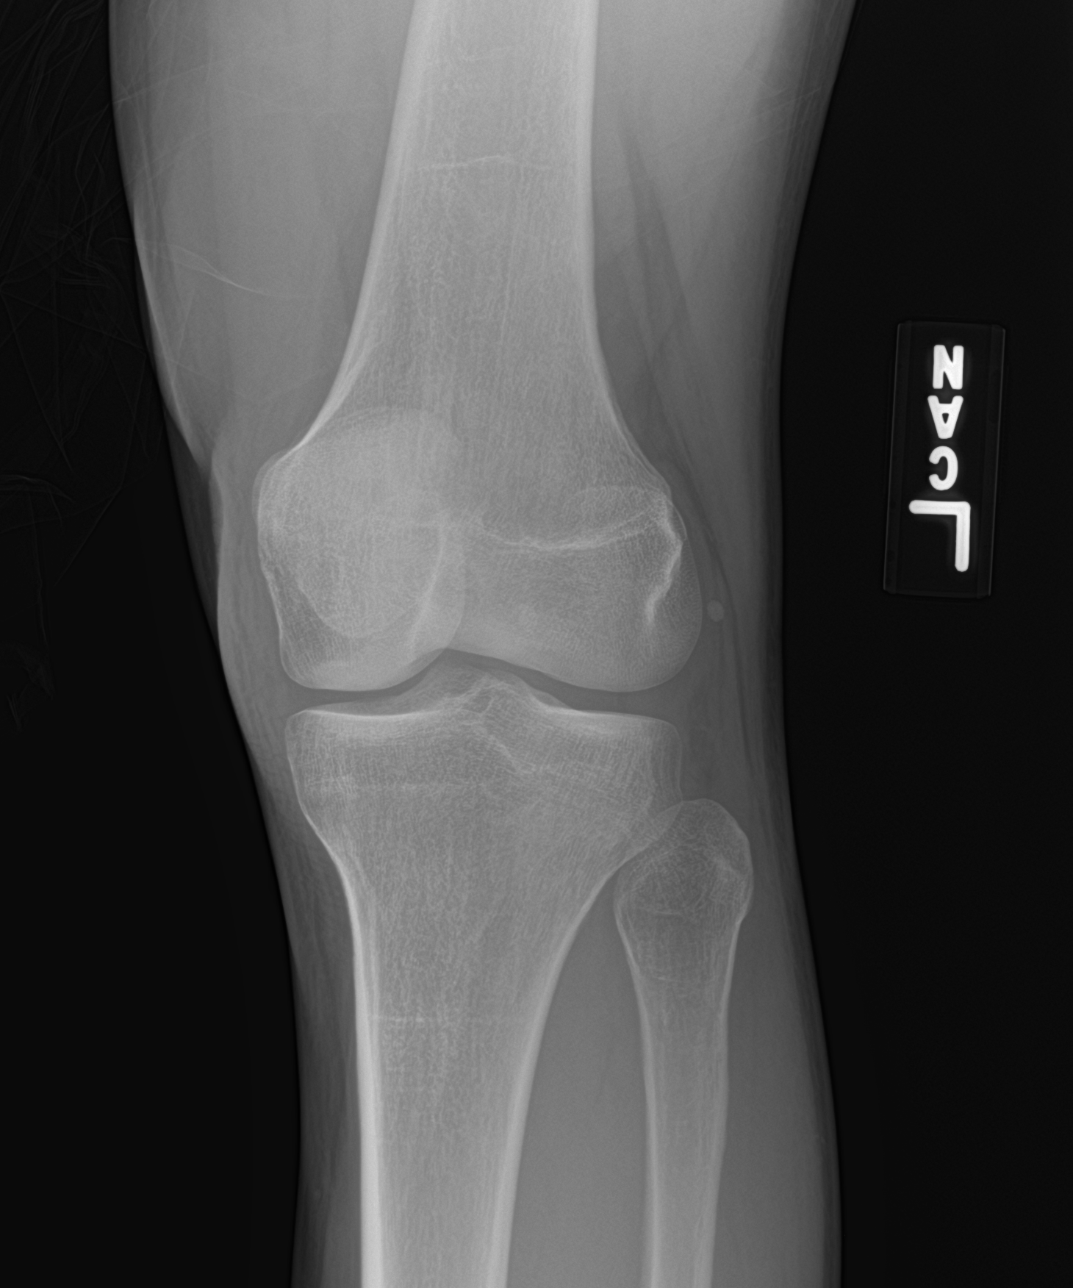

[knee obl (2 of 2)]
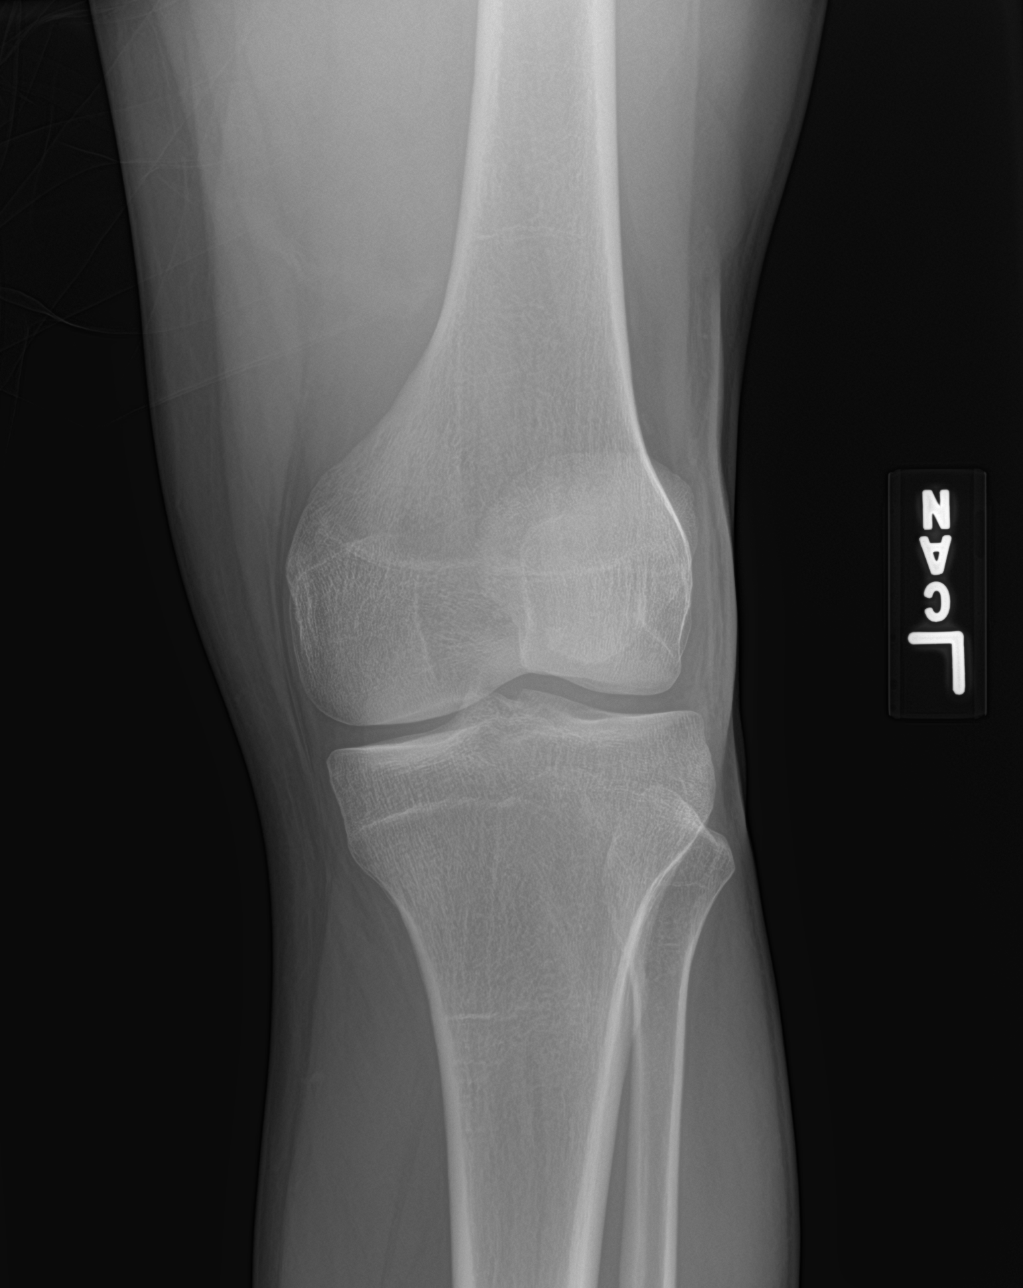

[4 of 4 positions shown; findings below may reference images not displayed]

FINDINGS: Frontal, lateral, and bilateral oblique views were obtained. There
is no fracture or dislocation. No joint effusion. Joint spaces
appear normal. No erosive change.
IMPRESSION: No fracture or dislocation. No joint effusion. No evident
arthropathy.

## 2022-08-19 DIAGNOSIS — L237 Allergic contact dermatitis due to plants, except food: Secondary | ICD-10-CM | POA: Diagnosis not present

## 2022-09-25 DIAGNOSIS — Z888 Allergy status to other drugs, medicaments and biological substances status: Secondary | ICD-10-CM | POA: Diagnosis not present

## 2022-09-25 DIAGNOSIS — F17210 Nicotine dependence, cigarettes, uncomplicated: Secondary | ICD-10-CM | POA: Diagnosis not present

## 2022-09-25 DIAGNOSIS — F1510 Other stimulant abuse, uncomplicated: Secondary | ICD-10-CM | POA: Diagnosis not present

## 2022-09-25 DIAGNOSIS — T887XXA Unspecified adverse effect of drug or medicament, initial encounter: Secondary | ICD-10-CM | POA: Diagnosis not present

## 2022-09-25 DIAGNOSIS — T401X1A Poisoning by heroin, accidental (unintentional), initial encounter: Secondary | ICD-10-CM | POA: Diagnosis not present

## 2022-09-25 DIAGNOSIS — I10 Essential (primary) hypertension: Secondary | ICD-10-CM | POA: Diagnosis not present

## 2022-09-25 DIAGNOSIS — F1110 Opioid abuse, uncomplicated: Secondary | ICD-10-CM | POA: Diagnosis not present

## 2022-09-25 DIAGNOSIS — Z5321 Procedure and treatment not carried out due to patient leaving prior to being seen by health care provider: Secondary | ICD-10-CM | POA: Diagnosis not present

## 2022-09-25 DIAGNOSIS — T50904A Poisoning by unspecified drugs, medicaments and biological substances, undetermined, initial encounter: Secondary | ICD-10-CM | POA: Diagnosis not present

## 2024-02-02 ENCOUNTER — Encounter (HOSPITAL_BASED_OUTPATIENT_CLINIC_OR_DEPARTMENT_OTHER): Payer: Self-pay | Admitting: Emergency Medicine

## 2024-02-02 ENCOUNTER — Emergency Department (HOSPITAL_BASED_OUTPATIENT_CLINIC_OR_DEPARTMENT_OTHER)
Admission: EM | Admit: 2024-02-02 | Discharge: 2024-02-02 | Disposition: A | Discharge status: Home / Self Care | Attending: Emergency Medicine | Admitting: Emergency Medicine

## 2024-02-02 ENCOUNTER — Other Ambulatory Visit: Payer: Self-pay

## 2024-02-02 ENCOUNTER — Emergency Department (HOSPITAL_BASED_OUTPATIENT_CLINIC_OR_DEPARTMENT_OTHER)

## 2024-02-02 DIAGNOSIS — S0990XA Unspecified injury of head, initial encounter: Secondary | ICD-10-CM | POA: Diagnosis present

## 2024-02-02 DIAGNOSIS — S0083XA Contusion of other part of head, initial encounter: Secondary | ICD-10-CM | POA: Insufficient documentation

## 2024-02-02 DIAGNOSIS — Y92410 Unspecified street and highway as the place of occurrence of the external cause: Secondary | ICD-10-CM | POA: Diagnosis not present

## 2024-02-02 DIAGNOSIS — M542 Cervicalgia: Secondary | ICD-10-CM | POA: Diagnosis not present

## 2024-02-02 MED ORDER — ACETAMINOPHEN 500 MG PO TABS
1000.0000 mg | ORAL_TABLET | Freq: Once | ORAL | Status: AC
  Administered 2024-02-02: 1000 mg via ORAL
  Filled 2024-02-02: qty 2

## 2024-02-02 NOTE — ED Notes (Signed)
Pt ambulatory to room with independent steady gait

## 2024-02-02 NOTE — ED Notes (Signed)
 Name called for room assignment but family reports he is currently in the restroom
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
## 2024-02-02 NOTE — Discharge Instructions (Signed)
 While you were in the emergency room, you had pictures done of your head neck and face that were all negative.  You likely will be more sore tomorrow.  You may take Motrin  and Tylenol  at home.  Follow-up with your primary care doctor.
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
## 2024-02-02 NOTE — ED Notes (Signed)
 ..  The patient is A&OX4, ambulatory at d/c with independent steady gait, NAD. Pt verbalized understanding of d/c instructions and follow up care.

## 2024-02-02 NOTE — ED Notes (Signed)
Dr Stevens at bedside.
